# Patient Record
Sex: Male | Born: 1985 | Race: White | Hispanic: No | Marital: Single | Smoking: Never smoker
Health system: Southern US, Community
[De-identification: ages and names within clinical notes are randomized; demographics above are authoritative.]

## PROBLEM LIST (undated history)

## (undated) DIAGNOSIS — M419 Scoliosis, unspecified: Secondary | ICD-10-CM

## (undated) DIAGNOSIS — F32A Depression, unspecified: Secondary | ICD-10-CM

## (undated) DIAGNOSIS — F429 Obsessive-compulsive disorder, unspecified: Secondary | ICD-10-CM

## (undated) DIAGNOSIS — F909 Attention-deficit hyperactivity disorder, unspecified type: Secondary | ICD-10-CM

## (undated) DIAGNOSIS — T7840XA Allergy, unspecified, initial encounter: Secondary | ICD-10-CM

## (undated) DIAGNOSIS — K219 Gastro-esophageal reflux disease without esophagitis: Secondary | ICD-10-CM

## (undated) DIAGNOSIS — G43909 Migraine, unspecified, not intractable, without status migrainosus: Secondary | ICD-10-CM

## (undated) DIAGNOSIS — M549 Dorsalgia, unspecified: Secondary | ICD-10-CM

## (undated) DIAGNOSIS — J45909 Unspecified asthma, uncomplicated: Secondary | ICD-10-CM

## (undated) DIAGNOSIS — F329 Major depressive disorder, single episode, unspecified: Secondary | ICD-10-CM

## (undated) HISTORY — DX: Attention-deficit hyperactivity disorder, unspecified type: F90.9

## (undated) HISTORY — DX: Scoliosis, unspecified: M41.9

## (undated) HISTORY — DX: Allergy, unspecified, initial encounter: T78.40XA

## (undated) HISTORY — DX: Unspecified asthma, uncomplicated: J45.909

## (undated) HISTORY — DX: Obsessive-compulsive disorder, unspecified: F42.9

## (undated) HISTORY — DX: Depression, unspecified: F32.A

## (undated) HISTORY — DX: Major depressive disorder, single episode, unspecified: F32.9

## (undated) HISTORY — DX: Gastro-esophageal reflux disease without esophagitis: K21.9

## (undated) HISTORY — DX: Dorsalgia, unspecified: M54.9

## (undated) HISTORY — DX: Migraine, unspecified, not intractable, without status migrainosus: G43.909

---
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
## 2006-09-14 ENCOUNTER — Emergency Department: Payer: Self-pay | Admitting: Emergency Medicine

## 2006-09-21 ENCOUNTER — Ambulatory Visit: Payer: Self-pay | Admitting: Pediatrics

## 2006-10-15 ENCOUNTER — Emergency Department: Payer: Self-pay | Admitting: Emergency Medicine

## 2007-10-04 ENCOUNTER — Other Ambulatory Visit: Payer: Self-pay

## 2007-10-04 ENCOUNTER — Ambulatory Visit: Payer: Self-pay | Admitting: Pediatrics

## 2008-05-10 ENCOUNTER — Emergency Department: Payer: Self-pay | Admitting: Unknown Physician Specialty

## 2011-01-17 ENCOUNTER — Emergency Department: Payer: Self-pay | Admitting: *Deleted

## 2013-10-01 ENCOUNTER — Emergency Department: Payer: Self-pay | Admitting: Emergency Medicine

## 2013-10-05 ENCOUNTER — Emergency Department: Payer: Self-pay | Admitting: Student

## 2013-10-05 LAB — URINALYSIS, COMPLETE
BILIRUBIN, UR: NEGATIVE
BLOOD: NEGATIVE
Bacteria: NONE SEEN
Glucose,UR: NEGATIVE mg/dL (ref 0–75)
Ketone: NEGATIVE
LEUKOCYTE ESTERASE: NEGATIVE
Nitrite: NEGATIVE
PH: 7 (ref 4.5–8.0)
PROTEIN: NEGATIVE
RBC,UR: NONE SEEN /HPF (ref 0–5)
SQUAMOUS EPITHELIAL: NONE SEEN
Specific Gravity: 1.005 (ref 1.003–1.030)
WBC UR: NONE SEEN /HPF (ref 0–5)

## 2013-10-26 DIAGNOSIS — N411 Chronic prostatitis: Secondary | ICD-10-CM | POA: Insufficient documentation

## 2013-10-26 DIAGNOSIS — N3090 Cystitis, unspecified without hematuria: Secondary | ICD-10-CM | POA: Insufficient documentation

## 2013-10-26 DIAGNOSIS — R1032 Left lower quadrant pain: Secondary | ICD-10-CM | POA: Insufficient documentation

## 2013-12-06 DIAGNOSIS — N23 Unspecified renal colic: Secondary | ICD-10-CM | POA: Insufficient documentation

## 2014-06-14 DIAGNOSIS — L918 Other hypertrophic disorders of the skin: Secondary | ICD-10-CM | POA: Insufficient documentation

## 2014-06-14 DIAGNOSIS — M4127 Other idiopathic scoliosis, lumbosacral region: Secondary | ICD-10-CM | POA: Insufficient documentation

## 2015-02-06 ENCOUNTER — Other Ambulatory Visit: Payer: Self-pay | Admitting: Family Medicine

## 2015-05-16 DIAGNOSIS — F411 Generalized anxiety disorder: Secondary | ICD-10-CM | POA: Diagnosis not present

## 2015-05-16 DIAGNOSIS — F900 Attention-deficit hyperactivity disorder, predominantly inattentive type: Secondary | ICD-10-CM | POA: Diagnosis not present

## 2015-06-14 DIAGNOSIS — F900 Attention-deficit hyperactivity disorder, predominantly inattentive type: Secondary | ICD-10-CM | POA: Diagnosis not present

## 2015-06-14 DIAGNOSIS — F411 Generalized anxiety disorder: Secondary | ICD-10-CM | POA: Diagnosis not present

## 2015-07-04 ENCOUNTER — Encounter: Payer: Self-pay | Admitting: *Deleted

## 2015-07-04 DIAGNOSIS — G894 Chronic pain syndrome: Secondary | ICD-10-CM | POA: Insufficient documentation

## 2015-07-04 DIAGNOSIS — F429 Obsessive-compulsive disorder, unspecified: Secondary | ICD-10-CM | POA: Insufficient documentation

## 2015-07-04 DIAGNOSIS — F909 Attention-deficit hyperactivity disorder, unspecified type: Secondary | ICD-10-CM | POA: Insufficient documentation

## 2015-07-04 DIAGNOSIS — J45909 Unspecified asthma, uncomplicated: Secondary | ICD-10-CM | POA: Insufficient documentation

## 2015-07-04 DIAGNOSIS — K219 Gastro-esophageal reflux disease without esophagitis: Secondary | ICD-10-CM | POA: Insufficient documentation

## 2015-07-05 ENCOUNTER — Ambulatory Visit (INDEPENDENT_AMBULATORY_CARE_PROVIDER_SITE_OTHER): Payer: Medicare Other | Admitting: Family Medicine

## 2015-07-05 ENCOUNTER — Encounter: Payer: Self-pay | Admitting: Family Medicine

## 2015-07-05 VITALS — BP 127/81 | HR 78 | Temp 98.0°F | Resp 16 | Ht 74.0 in | Wt 204.0 lb

## 2015-07-05 DIAGNOSIS — M545 Low back pain, unspecified: Secondary | ICD-10-CM

## 2015-07-05 DIAGNOSIS — J4520 Mild intermittent asthma, uncomplicated: Secondary | ICD-10-CM

## 2015-07-05 MED ORDER — MELOXICAM 15 MG PO TABS: 15.0000 mg | ORAL_TABLET | Freq: Every day | ORAL | Status: DC

## 2015-07-05 MED ORDER — BACLOFEN 10 MG PO TABS: 10.0000 mg | ORAL_TABLET | Freq: Three times a day (TID) | ORAL | Status: DC

## 2015-07-05 MED ORDER — BUDESONIDE-FORMOTEROL FUMARATE 160-4.5 MCG/ACT IN AERO: 2.0000 | INHALATION_SPRAY | Freq: Two times a day (BID) | RESPIRATORY_TRACT | Status: DC

## 2015-07-05 MED ORDER — ALBUTEROL SULFATE HFA 108 (90 BASE) MCG/ACT IN AERS: 2.0000 | INHALATION_SPRAY | Freq: Four times a day (QID) | RESPIRATORY_TRACT | Status: DC | PRN

## 2015-07-05 NOTE — Progress Notes (Signed)
Name: Phillip Buckley   MRN: [id number]    DOB: 01/01/1986   Date:07/05/2015       Progress Note  Subjective  Chief Complaint  Chief Complaint  Patient presents with  . Back Pain    h/o  . Asthma    HPI Here after long absence.  C/o some back pain on and off.  Has hx. Of scoliosis.  Sees Dr. Achilles Dunkope for Prostate pain.  Also sees [facility] for depression and OCD.  Has asthma.  Has not had a bad asthma flair 2 yrs ago.  Used Advair once daily.  Has ProAir rescue inhaler. No problem-specific assessment & plan notes found for this encounter.   Past Medical History  Diagnosis Date  . Asthma   . OCD (obsessive compulsive disorder)   . GERD (gastroesophageal reflux disease)   . ADHD (attention deficit hyperactivity disorder)   . Back pain     Past Surgical History  Procedure Laterality Date  . Tonsillectomy and adenoidectomy  1994    Family History  Problem Relation Age of Onset  . Osteoporosis Mother   . Diabetes Mellitus II      grandmother  . Cancer - Colon      grandfather    Social History   Social History  . Marital Status: Single    Spouse Name: N/A  . Number of Children: N/A  . Years of Education: N/A   Occupational History  . Not on file.   Social History Main Topics  . Smoking status: Never Smoker   . Smokeless tobacco: Never Used  . Alcohol Use: 0.0 oz/week    0 Standard drinks or equivalent per week     Comment: occasional  . Drug Use: No  . Sexual Activity: Not on file   Other Topics Concern  . Not on file   Social History Narrative     Current outpatient prescriptions:  .  ALPRAZolam (XANAX) 0.5 MG tablet, Take 0.5 mg by mouth daily., Disp: , Rfl: 0 .  FLUoxetine HCl 60 MG TABS, Take 1 tablet by mouth every morning., Disp: , Rfl: 0 .  meloxicam (MOBIC) 15 MG tablet, Take 1 tablet (15 mg total) by mouth daily., Disp: 30 tablet, Rfl: 12 .  omeprazole (PRILOSEC) 20 MG capsule, Take 20 mg by mouth daily., Disp: , Rfl:  .  tamsulosin (FLOMAX)  0.4 MG CAPS capsule, Take 0.4 mg by mouth daily., Disp: , Rfl: 0 .  zolpidem (AMBIEN) 10 MG tablet, Take 10 mg by mouth at bedtime as needed for sleep., Disp: , Rfl:  .  albuterol (PROVENTIL HFA;VENTOLIN HFA) 108 (90 Base) MCG/ACT inhaler, Inhale 2 puffs into the lungs every 6 (six) hours as needed for wheezing or shortness of breath., Disp: 1 Inhaler, Rfl: 12 .  baclofen (LIORESAL) 10 MG tablet, Take 1 tablet (10 mg total) by mouth 3 (three) times daily., Disp: 60 each, Rfl: 6 .  budesonide-formoterol (SYMBICORT) 160-4.5 MCG/ACT inhaler, Inhale 2 puffs into the lungs 2 (two) times daily., Disp: 1 Inhaler, Rfl: 12  Allergies  Allergen Reactions  . Benadryl [Diphenhydramine Hcl]   . Codeine Phosphate [Codeine]   . Penicillins      Review of Systems  Constitutional: Negative for fever, chills, weight loss and malaise/fatigue.  HENT: Negative for hearing loss.   Eyes: Negative for blurred vision and double vision.  Respiratory: Negative for cough, shortness of breath and wheezing.   Cardiovascular: Negative for chest pain, palpitations and leg swelling.  Gastrointestinal:  Negative for heartburn, abdominal pain and blood in stool.  Genitourinary: Negative for dysuria, urgency and frequency.  Musculoskeletal: Positive for back pain (scoliosis).  Skin: Negative for rash.  Neurological: Negative for tremors, weakness and headaches.      Objective  Filed Vitals:   07/05/15 1356  BP: 127/81  Pulse: 78  Temp: 98 F (36.7 C)  TempSrc: Oral  Resp: 16  Height: 6\' 2"  (1.88 m)  Weight: 204 lb (92.534 kg)    Physical Exam  Constitutional: He is oriented to person, place, and time and well-developed, well-nourished, and in no distress. No distress.  HENT:  Head: Normocephalic and atraumatic.  Eyes: Conjunctivae and EOM are normal. Pupils are equal, round, and reactive to light.  Neck: Normal range of motion. Neck supple. Carotid bruit is not present. No thyromegaly present.   Cardiovascular: Normal rate, regular rhythm and normal heart sounds.  Exam reveals no gallop and no friction rub.   No murmur heard. Pulmonary/Chest: Effort normal and breath sounds normal. No respiratory distress. He has no wheezes. He has no rales.  Abdominal: Soft. Bowel sounds are normal.  Musculoskeletal:  Mild scoliosis of thoracic and lumbar spine.  Some pain with flexion and extension of back.  Lymphadenopathy:    He has no cervical adenopathy.  Neurological: He is alert and oriented to person, place, and time.  Vitals reviewed.      No results found for this or any previous visit (from the past 2160 hour(s)).   Assessment & Plan  Problem List Items Addressed This Visit      Respiratory   Asthma   Relevant Medications   budesonide-formoterol (SYMBICORT) 160-4.5 MCG/ACT inhaler   albuterol (PROVENTIL HFA;VENTOLIN HFA) 108 (90 Base) MCG/ACT inhaler     Other   Low back pain - Primary   Relevant Medications   meloxicam (MOBIC) 15 MG tablet   baclofen (LIORESAL) 10 MG tablet      Meds ordered this encounter  Medications  . ALPRAZolam (XANAX) 0.5 MG tablet    Sig: Take 0.5 mg by mouth daily.    Refill:  0  . DISCONTD: ADVAIR DISKUS 250-50 MCG/DOSE AEPB    Sig: Inhale 1 puff into the lungs as needed.    Refill:  1  . FLUoxetine HCl 60 MG TABS    Sig: Take 1 tablet by mouth every morning.    Refill:  0  . tamsulosin (FLOMAX) 0.4 MG CAPS capsule    Sig: Take 0.4 mg by mouth daily.    Refill:  0  . omeprazole (PRILOSEC) 20 MG capsule    Sig: Take 20 mg by mouth daily.  Marland Kitchen DISCONTD: meloxicam (MOBIC) 15 MG tablet    Sig: Take 15 mg by mouth daily.  Marland Kitchen DISCONTD: cyclobenzaprine (FLEXERIL) 10 MG tablet    Sig: Take 10 mg by mouth daily as needed for muscle spasms.  Marland Kitchen zolpidem (AMBIEN) 10 MG tablet    Sig: Take 10 mg by mouth at bedtime as needed for sleep.  . meloxicam (MOBIC) 15 MG tablet    Sig: Take 1 tablet (15 mg total) by mouth daily.    Dispense:  30  tablet    Refill:  12  . baclofen (LIORESAL) 10 MG tablet    Sig: Take 1 tablet (10 mg total) by mouth 3 (three) times daily.    Dispense:  60 each    Refill:  6  . budesonide-formoterol (SYMBICORT) 160-4.5 MCG/ACT inhaler    Sig: Inhale 2 puffs  into the lungs 2 (two) times daily.    Dispense:  1 Inhaler    Refill:  12  . albuterol (PROVENTIL HFA;VENTOLIN HFA) 108 (90 Base) MCG/ACT inhaler    Sig: Inhale 2 puffs into the lungs every 6 (six) hours as needed for wheezing or shortness of breath.    Dispense:  1 Inhaler    Refill:  12   1. Bilateral low back pain without sciatica  - meloxicam (MOBIC) 15 MG tablet; Take 1 tablet (15 mg total) by mouth daily.  Dispense: 30 tablet; Refill: 12 - baclofen (LIORESAL) 10 MG tablet; Take 1 tablet (10 mg total) by mouth 3 (three) times daily.  Dispense: 60 each; Refill: 6  2. Asthma, mild intermittent, uncomplicated  - budesonide-formoterol (SYMBICORT) 160-4.5 MCG/ACT inhaler; Inhale 2 puffs into the lungs 2 (two) times daily.  Dispense: 1 Inhaler; Refill: 12 - albuterol (PROVENTIL HFA;VENTOLIN HFA) 108 (90 Base) MCG/ACT inhaler; Inhale 2 puffs into the lungs every 6 (six) hours as needed for wheezing or shortness of breath.  Dispense: 1 Inhaler; Refill: 12

## 2015-07-05 NOTE — Patient Instructions (Signed)
Cont to go to [facility] and cont. To see Dr. Achilles Dunkope for urology.

## 2015-07-09 DIAGNOSIS — F411 Generalized anxiety disorder: Secondary | ICD-10-CM | POA: Diagnosis not present

## 2015-07-09 DIAGNOSIS — F900 Attention-deficit hyperactivity disorder, predominantly inattentive type: Secondary | ICD-10-CM | POA: Diagnosis not present

## 2015-07-13 ENCOUNTER — Other Ambulatory Visit: Payer: Self-pay | Admitting: Family Medicine

## 2015-07-13 NOTE — Telephone Encounter (Signed)
Called pharmacy RE: changing Advair to Symbicort 160/4.5 mg.

## 2015-07-18 ENCOUNTER — Other Ambulatory Visit: Payer: Self-pay | Admitting: Family Medicine

## 2015-07-18 DIAGNOSIS — M545 Low back pain, unspecified: Secondary | ICD-10-CM

## 2015-07-18 MED ORDER — OMEPRAZOLE 20 MG PO CPDR: 20.0000 mg | DELAYED_RELEASE_CAPSULE | Freq: Every day | ORAL | Status: DC

## 2015-08-03 DIAGNOSIS — F900 Attention-deficit hyperactivity disorder, predominantly inattentive type: Secondary | ICD-10-CM | POA: Diagnosis not present

## 2015-08-03 DIAGNOSIS — F411 Generalized anxiety disorder: Secondary | ICD-10-CM | POA: Diagnosis not present

## 2015-09-07 DIAGNOSIS — F411 Generalized anxiety disorder: Secondary | ICD-10-CM | POA: Diagnosis not present

## 2015-09-25 ENCOUNTER — Ambulatory Visit (INDEPENDENT_AMBULATORY_CARE_PROVIDER_SITE_OTHER): Payer: Medicare Other | Admitting: Family Medicine

## 2015-09-25 ENCOUNTER — Encounter: Payer: Self-pay | Admitting: Family Medicine

## 2015-09-25 VITALS — BP 120/79 | HR 69 | Temp 98.5°F | Ht 74.0 in | Wt 214.4 lb

## 2015-09-25 DIAGNOSIS — F429 Obsessive-compulsive disorder, unspecified: Secondary | ICD-10-CM | POA: Diagnosis not present

## 2015-09-25 DIAGNOSIS — S39011D Strain of muscle, fascia and tendon of abdomen, subsequent encounter: Secondary | ICD-10-CM | POA: Diagnosis not present

## 2015-09-25 DIAGNOSIS — S76219A Strain of adductor muscle, fascia and tendon of unspecified thigh, initial encounter: Secondary | ICD-10-CM | POA: Insufficient documentation

## 2015-09-25 DIAGNOSIS — F901 Attention-deficit hyperactivity disorder, predominantly hyperactive type: Secondary | ICD-10-CM

## 2015-09-25 DIAGNOSIS — J4520 Mild intermittent asthma, uncomplicated: Secondary | ICD-10-CM

## 2015-09-25 MED ORDER — METAXALONE 800 MG PO TABS: 800.0000 mg | ORAL_TABLET | Freq: Three times a day (TID) | ORAL | 3 refills | Status: DC

## 2015-09-25 NOTE — Progress Notes (Signed)
Name: Phillip Buckley   MRN: [id number]    DOB: May 23, 1985   Date:09/25/2015       Progress Note  Subjective  Chief Complaint  Chief Complaint  Patient presents with  . paperwork  . Hernia    pressure in right groin    HPI Here for 2 issues.  Needs note to take to social worker to get disability checks and insurance coverage in his name instead of through his mother.  He is disabled b/o "nerves".  He is no longer a minor.  Sees Psych for his Mental health issues.  Also still has a "pulling" sensation in R groin intermittantly.  Mildly painful.  No hernia has been found on evaluation.  Flexeril helps some.  No problem-specific Assessment & Plan notes found for this encounter.   Past Medical History:  Diagnosis Date  . ADHD (attention deficit hyperactivity disorder)   . Asthma   . Back pain   . GERD (gastroesophageal reflux disease)   . OCD (obsessive compulsive disorder)     Past Surgical History:  Procedure Laterality Date  . TONSILLECTOMY AND ADENOIDECTOMY  1994    Family History  Problem Relation Age of Onset  . Osteoporosis Mother   . Diabetes Mellitus II      grandmother  . Cancer - Colon      grandfather    Social History   Social History  . Marital status: Single    Spouse name: N/A  . Number of children: N/A  . Years of education: N/A   Occupational History  . Not on file.   Social History Main Topics  . Smoking status: Never Smoker  . Smokeless tobacco: Never Used  . Alcohol use 0.0 oz/week     Comment: occasional  . Drug use: No  . Sexual activity: Not on file   Other Topics Concern  . Not on file   Social History Narrative  . No narrative on file     Current Outpatient Prescriptions:  .  albuterol (PROVENTIL HFA;VENTOLIN HFA) 108 (90 Base) MCG/ACT inhaler, Inhale 2 puffs into the lungs every 6 (six) hours as needed for wheezing or shortness of breath., Disp: 1 Inhaler, Rfl: 12 .  ALPRAZolam (XANAX) 0.5 MG tablet, Take 0.5 mg by mouth  daily., Disp: , Rfl: 0 .  amphetamine-dextroamphetamine (ADDERALL XR) 20 MG 24 hr capsule, Take 20 mg by mouth daily., Disp: , Rfl:  .  budesonide-formoterol (SYMBICORT) 160-4.5 MCG/ACT inhaler, Inhale 2 puffs into the lungs 2 (two) times daily., Disp: 1 Inhaler, Rfl: 12 .  divalproex (DEPAKOTE ER) 500 MG 24 hr tablet, Take 500 mg by mouth daily., Disp: , Rfl:  .  FLUoxetine HCl 60 MG TABS, Take 1 tablet by mouth every morning., Disp: , Rfl: 0 .  Fluticasone-Salmeterol (ADVAIR DISKUS) 250-50 MCG/DOSE AEPB, Inhale 1 puff into the lungs daily., Disp: , Rfl:  .  meloxicam (MOBIC) 15 MG tablet, Take 1 tablet (15 mg total) by mouth daily., Disp: 30 tablet, Rfl: 12 .  naproxen (NAPROSYN) 500 MG tablet, Take 500 mg by mouth as needed., Disp: , Rfl:  .  omeprazole (PRILOSEC) 20 MG capsule, Take 1 capsule (20 mg total) by mouth daily., Disp: 30 capsule, Rfl: 12 .  tamsulosin (FLOMAX) 0.4 MG CAPS capsule, Take 0.4 mg by mouth daily., Disp: , Rfl: 0 .  baclofen (LIORESAL) 10 MG tablet, Take 1 tablet (10 mg total) by mouth 3 (three) times daily. (Patient not taking: Reported on 09/25/2015), Disp: 60  each, Rfl: 6 .  cetirizine (ZYRTEC) 10 MG tablet, Take 10 mg by mouth daily., Disp: , Rfl:  .  cyclobenzaprine (FLEXERIL) 10 MG tablet, Take 10 mg by mouth daily., Disp: , Rfl:  .  metaxalone (SKELAXIN) 800 MG tablet, Take 1 tablet (800 mg total) by mouth 3 (three) times daily., Disp: 40 tablet, Rfl: 3  Allergies  Allergen Reactions  . Benadryl [Diphenhydramine Hcl]   . Codeine Phosphate [Codeine]   . Penicillins      Review of Systems  Constitutional: Negative for chills, fever, malaise/fatigue and weight loss.  HENT: Negative for hearing loss.   Eyes: Negative for blurred vision and double vision.  Respiratory: Negative for cough, shortness of breath and wheezing.   Cardiovascular: Negative for chest pain and palpitations.  Gastrointestinal: Positive for abdominal pain (intermittant pain in R groin).  Negative for blood in stool, heartburn and nausea.  Genitourinary: Negative for dysuria, frequency and urgency.  Musculoskeletal: Positive for myalgias. Negative for joint pain.  Skin: Negative for rash.  Neurological: Negative for weakness and headaches.  Psychiatric/Behavioral: The patient is nervous/anxious (mild).      Objective  Vitals:   09/25/15 1021  BP: 120/79  Pulse: 69  Temp: 98.5 F (36.9 C)  TempSrc: Oral  Weight: 214 lb 6.4 oz (97.3 kg)  Height: 6\' 2"  (1.88 m)    Physical Exam  Constitutional: He is oriented to person, place, and time and well-developed, well-nourished, and in no distress. No distress.  HENT:  Head: Normocephalic and atraumatic.  Eyes: Conjunctivae and EOM are normal. Pupils are equal, round, and reactive to light. No scleral icterus.  Neck: Normal range of motion. Neck supple. Carotid bruit is not present. No thyromegaly present.  Cardiovascular: Normal rate, regular rhythm and normal heart sounds.   Pulmonary/Chest: Effort normal and breath sounds normal.  Abdominal: Soft. Bowel sounds are normal. He exhibits no distension and no mass. There is no tenderness.  No tenderness or mass in R groin today.  Musculoskeletal: He exhibits no edema.  Lymphadenopathy:    He has no cervical adenopathy.  Neurological: He is alert and oriented to person, place, and time.  Psychiatric:  Mildly anxious.  Has OCD  Vitals reviewed.      No results found for this or any previous visit (from the past 2160 hour(s)).   Assessment & Plan  Problem List Items Addressed This Visit      Respiratory   Asthma   Relevant Medications   Fluticasone-Salmeterol (ADVAIR DISKUS) 250-50 MCG/DOSE AEPB     Musculoskeletal and Integument   Strain of groin   Relevant Medications   metaxalone (SKELAXIN) 800 MG tablet     Other   OCD (obsessive compulsive disorder) - Primary   ADHD (attention deficit hyperactivity disorder)    Other Visit Diagnoses   None.      Meds ordered this encounter  Medications  . Fluticasone-Salmeterol (ADVAIR DISKUS) 250-50 MCG/DOSE AEPB    Sig: Inhale 1 puff into the lungs daily.  . cyclobenzaprine (FLEXERIL) 10 MG tablet    Sig: Take 10 mg by mouth daily.  Marland Kitchen amphetamine-dextroamphetamine (ADDERALL XR) 20 MG 24 hr capsule    Sig: Take 20 mg by mouth daily.  . divalproex (DEPAKOTE ER) 500 MG 24 hr tablet    Sig: Take 500 mg by mouth daily.  . cetirizine (ZYRTEC) 10 MG tablet    Sig: Take 10 mg by mouth daily.  . naproxen (NAPROSYN) 500 MG tablet    Sig:  Take 500 mg by mouth as needed.  . metaxalone (SKELAXIN) 800 MG tablet    Sig: Take 1 tablet (800 mg total) by mouth 3 (three) times daily.    Dispense:  40 tablet    Refill:  3     1. OCD (obsessive compulsive disorder) Cot to see Psych. Get note from {sych re: his competenance to handle money  2. Attention-deficit hyperactivity disorder, predominantly hyperactive type   3. Asthma, mild intermittent, uncomplicated   4. Strain of groin, subsequent encounter  - metaxalone (SKELAXIN) 800 MG tablet; Take 1 tablet (800 mg total) by mouth 3 (three) times daily.  Dispense: 40 tablet; Refill: 3

## 2015-09-25 NOTE — Patient Instructions (Signed)
Needs note from his Psychiatrist attesting to his competence with handling his finances.  I will attest to Medicare and Child psychotherapist after that.

## 2015-10-05 DIAGNOSIS — F411 Generalized anxiety disorder: Secondary | ICD-10-CM | POA: Diagnosis not present

## 2015-10-09 DIAGNOSIS — F411 Generalized anxiety disorder: Secondary | ICD-10-CM | POA: Diagnosis not present

## 2015-10-21 DIAGNOSIS — K089 Disorder of teeth and supporting structures, unspecified: Secondary | ICD-10-CM | POA: Diagnosis not present

## 2015-10-21 DIAGNOSIS — K047 Periapical abscess without sinus: Secondary | ICD-10-CM | POA: Diagnosis not present

## 2015-10-23 ENCOUNTER — Telehealth: Payer: Self-pay | Admitting: Family Medicine

## 2015-10-23 NOTE — Telephone Encounter (Signed)
Patient has already tried Baclofen and it didn't help. What else can he try?

## 2015-10-23 NOTE — Telephone Encounter (Signed)
Send in Baclofen, 10 mg., 1 up to three times a day for muscle spasm (/330 / 1 refill).-jh

## 2015-10-23 NOTE — Telephone Encounter (Signed)
Will try Cyclobenzaprine 10 mg., 1 up to three times a day for muscle spasm.  #30 /1 refill.

## 2015-10-23 NOTE — Telephone Encounter (Signed)
Pt. Called states that insurance will not  Pay for  Metaxalone  800 mg requesting something  Karolee Ohslse to be called in  . Pt call back  # is  [phone]

## 2015-10-24 NOTE — Telephone Encounter (Signed)
Attempted to call patient no vmail setup.

## 2015-10-25 NOTE — Telephone Encounter (Signed)
Patient tried cyclobenzaprine already before Baclofen.

## 2015-10-25 NOTE — Telephone Encounter (Signed)
I am out of choices.  He could try the Baclofen and pay the higher co-pay.  I have no other suggestions.-jh

## 2015-10-26 NOTE — Telephone Encounter (Signed)
Unable to contact patient will close message.

## 2015-11-07 DIAGNOSIS — F411 Generalized anxiety disorder: Secondary | ICD-10-CM | POA: Diagnosis not present

## 2015-12-05 DIAGNOSIS — F411 Generalized anxiety disorder: Secondary | ICD-10-CM | POA: Diagnosis not present

## 2015-12-05 DIAGNOSIS — F900 Attention-deficit hyperactivity disorder, predominantly inattentive type: Secondary | ICD-10-CM | POA: Diagnosis not present

## 2016-01-07 ENCOUNTER — Ambulatory Visit (INDEPENDENT_AMBULATORY_CARE_PROVIDER_SITE_OTHER): Payer: Medicare Other | Admitting: Family Medicine

## 2016-01-07 ENCOUNTER — Encounter: Payer: Self-pay | Admitting: Family Medicine

## 2016-01-07 ENCOUNTER — Other Ambulatory Visit: Payer: Self-pay | Admitting: *Deleted

## 2016-01-07 VITALS — BP 125/82 | HR 69 | Temp 98.2°F | Resp 16 | Ht 74.0 in | Wt 215.0 lb

## 2016-01-07 DIAGNOSIS — M545 Low back pain: Secondary | ICD-10-CM | POA: Diagnosis not present

## 2016-01-07 DIAGNOSIS — G8929 Other chronic pain: Secondary | ICD-10-CM

## 2016-01-07 DIAGNOSIS — Z23 Encounter for immunization: Secondary | ICD-10-CM

## 2016-01-07 MED ORDER — BACLOFEN 20 MG PO TABS: 10.0000 mg | ORAL_TABLET | Freq: Four times a day (QID) | ORAL | 6 refills | Status: DC

## 2016-01-07 MED ORDER — NAPROXEN 500 MG PO TABS: 500.0000 mg | ORAL_TABLET | Freq: Two times a day (BID) | ORAL | 6 refills | Status: DC

## 2016-01-07 NOTE — Progress Notes (Signed)
Name: Phillip Buckley   MRN: [id number]    DOB: Jun 22, 1985   Date:01/07/2016       Progress Note  Subjective  Chief Complaint  Chief Complaint  Patient presents with  . Follow-up    back pain    HPI Here for f/u of back pain that is chronic.  Takes Flexeril and Naproxyn every day.  He states that low dose Baclofen twice a day doesn't seem to help.  He has tried PT before and it has not been effective.   No problem-specific Assessment & Plan notes found for this encounter.   Past Medical History:  Diagnosis Date  . ADHD (attention deficit hyperactivity disorder)   . Asthma   . Back pain   . GERD (gastroesophageal reflux disease)   . OCD (obsessive compulsive disorder)     Past Surgical History:  Procedure Laterality Date  . TONSILLECTOMY AND ADENOIDECTOMY  1994    Family History  Problem Relation Age of Onset  . Osteoporosis Mother   . Diabetes Mellitus II      grandmother  . Cancer - Colon      grandfather    Social History   Social History  . Marital status: Single    Spouse name: N/A  . Number of children: N/A  . Years of education: N/A   Occupational History  . Not on file.   Social History Main Topics  . Smoking status: Never Smoker  . Smokeless tobacco: Never Used  . Alcohol use 0.0 oz/week     Comment: occasional  . Drug use: No  . Sexual activity: Not on file   Other Topics Concern  . Not on file   Social History Narrative  . No narrative on file     Current Outpatient Prescriptions:  .  albuterol (PROVENTIL HFA;VENTOLIN HFA) 108 (90 Base) MCG/ACT inhaler, Inhale 2 puffs into the lungs every 6 (six) hours as needed for wheezing or shortness of breath., Disp: 1 Inhaler, Rfl: 12 .  ALPRAZolam (XANAX) 0.5 MG tablet, Take 0.5 mg by mouth daily., Disp: , Rfl: 0 .  amphetamine-dextroamphetamine (ADDERALL XR) 20 MG 24 hr capsule, Take 20 mg by mouth daily., Disp: , Rfl:  .  baclofen (LIORESAL) 10 MG tablet, Take 1 tablet (10 mg total) by mouth 3  (three) times daily., Disp: 60 each, Rfl: 6 .  budesonide-formoterol (SYMBICORT) 160-4.5 MCG/ACT inhaler, Inhale 2 puffs into the lungs 2 (two) times daily., Disp: 1 Inhaler, Rfl: 12 .  cetirizine (ZYRTEC) 10 MG tablet, Take 10 mg by mouth daily., Disp: , Rfl:  .  cyclobenzaprine (FLEXERIL) 10 MG tablet, Take 10 mg by mouth daily., Disp: , Rfl:  .  divalproex (DEPAKOTE ER) 500 MG 24 hr tablet, Take 500 mg by mouth daily., Disp: , Rfl:  .  FLUoxetine HCl 60 MG TABS, Take 1 tablet by mouth every morning., Disp: , Rfl: 0 .  Fluticasone-Salmeterol (ADVAIR DISKUS) 250-50 MCG/DOSE AEPB, Inhale 1 puff into the lungs daily., Disp: , Rfl:  .  naproxen (NAPROSYN) 500 MG tablet, Take 500 mg by mouth as needed., Disp: , Rfl:  .  omeprazole (PRILOSEC) 20 MG capsule, Take 1 capsule (20 mg total) by mouth daily., Disp: 30 capsule, Rfl: 12 .  tamsulosin (FLOMAX) 0.4 MG CAPS capsule, Take 0.4 mg by mouth daily., Disp: , Rfl: 0  Allergies  Allergen Reactions  . Benadryl [Diphenhydramine Hcl]   . Codeine Phosphate [Codeine]   . Penicillins      Review  of Systems  Constitutional: Negative.   HENT: Negative.   Eyes: Negative.   Respiratory: Negative.   Cardiovascular: Negative.   Gastrointestinal: Negative.   Genitourinary: Negative.   Musculoskeletal: Positive for back pain (chronic).  Skin: Negative.   Neurological: Negative.   Endo/Heme/Allergies: Negative.       Objective  Vitals:   01/07/16 0812  BP: 125/82  Pulse: 69  Resp: 16  Temp: 98.2 F (36.8 C)  TempSrc: Oral  Weight: 215 lb (97.5 kg)  Height: 6\' 2"  (1.88 m)    Physical Exam  Constitutional: He is oriented to person, place, and time and well-developed, well-nourished, and in no distress. No distress.  Cardiovascular: Normal rate, regular rhythm and normal heart sounds.   Pulmonary/Chest: Effort normal and breath sounds normal.  Musculoskeletal: He exhibits no edema.  Mild pain to palpation of R thoracic parascapular  muscles and L lower lumbar muscles and L shoulder traps.    Neurological: He is alert and oriented to person, place, and time.  Vitals reviewed.      No results found for this or any previous visit (from the past 2160 hour(s)).   Assessment & Plan  Problem List Items Addressed This Visit    None    Visit Diagnoses    Need for vaccination    -  Primary   Relevant Orders   Flu Vaccine QUAD 36+ mos PF IM (Fluarix & Fluzone Quad PF) (Completed)      No orders of the defined types were placed in this encounter.

## 2016-01-18 ENCOUNTER — Other Ambulatory Visit: Payer: Self-pay | Admitting: Family Medicine

## 2016-01-18 MED ORDER — OMEPRAZOLE 20 MG PO CPDR: 20.0000 mg | DELAYED_RELEASE_CAPSULE | Freq: Every day | ORAL | 1 refills | Status: DC

## 2016-01-29 DIAGNOSIS — F411 Generalized anxiety disorder: Secondary | ICD-10-CM | POA: Diagnosis not present

## 2016-03-27 DIAGNOSIS — F411 Generalized anxiety disorder: Secondary | ICD-10-CM | POA: Diagnosis not present

## 2016-04-22 DIAGNOSIS — F411 Generalized anxiety disorder: Secondary | ICD-10-CM | POA: Diagnosis not present

## 2016-04-29 DIAGNOSIS — N411 Chronic prostatitis: Secondary | ICD-10-CM | POA: Diagnosis not present

## 2016-04-29 DIAGNOSIS — R1032 Left lower quadrant pain: Secondary | ICD-10-CM | POA: Diagnosis not present

## 2016-07-22 ENCOUNTER — Other Ambulatory Visit: Payer: Self-pay

## 2016-07-22 DIAGNOSIS — J45901 Unspecified asthma with (acute) exacerbation: Secondary | ICD-10-CM

## 2016-07-22 MED ORDER — ALBUTEROL SULFATE HFA 108 (90 BASE) MCG/ACT IN AERS: 2.0000 | INHALATION_SPRAY | Freq: Four times a day (QID) | RESPIRATORY_TRACT | 1 refills | Status: DC | PRN

## 2016-07-22 MED ORDER — BUDESONIDE-FORMOTEROL FUMARATE 160-4.5 MCG/ACT IN AERO: 2.0000 | INHALATION_SPRAY | Freq: Two times a day (BID) | RESPIRATORY_TRACT | 12 refills | Status: AC

## 2016-09-19 ENCOUNTER — Other Ambulatory Visit: Payer: Self-pay | Admitting: Family Medicine

## 2016-09-19 DIAGNOSIS — J45901 Unspecified asthma with (acute) exacerbation: Secondary | ICD-10-CM

## 2016-10-28 ENCOUNTER — Ambulatory Visit
Admission: RE | Admit: 2016-10-28 | Discharge: 2016-10-28 | Disposition: A | Discharge status: Home / Self Care | Payer: Medicare Other | Source: Ambulatory Visit | Attending: Family Medicine | Admitting: Family Medicine

## 2016-10-28 ENCOUNTER — Encounter: Payer: Self-pay | Admitting: Family Medicine

## 2016-10-28 ENCOUNTER — Ambulatory Visit (INDEPENDENT_AMBULATORY_CARE_PROVIDER_SITE_OTHER): Payer: Medicare Other | Admitting: Family Medicine

## 2016-10-28 ENCOUNTER — Other Ambulatory Visit: Payer: Self-pay | Admitting: Family Medicine

## 2016-10-28 VITALS — BP 136/67 | HR 69 | Temp 98.1°F | Resp 16 | Ht 74.0 in | Wt 220.0 lb

## 2016-10-28 DIAGNOSIS — M546 Pain in thoracic spine: Secondary | ICD-10-CM | POA: Insufficient documentation

## 2016-10-28 DIAGNOSIS — M545 Low back pain: Secondary | ICD-10-CM | POA: Diagnosis not present

## 2016-10-28 DIAGNOSIS — Z114 Encounter for screening for human immunodeficiency virus [HIV]: Secondary | ICD-10-CM

## 2016-10-28 DIAGNOSIS — R42 Dizziness and giddiness: Secondary | ICD-10-CM

## 2016-10-28 DIAGNOSIS — M47899 Other spondylosis, site unspecified: Secondary | ICD-10-CM | POA: Insufficient documentation

## 2016-10-28 DIAGNOSIS — M41127 Adolescent idiopathic scoliosis, lumbosacral region: Secondary | ICD-10-CM | POA: Insufficient documentation

## 2016-10-28 DIAGNOSIS — Z0000 Encounter for general adult medical examination without abnormal findings: Secondary | ICD-10-CM

## 2016-10-28 DIAGNOSIS — Z79899 Other long term (current) drug therapy: Secondary | ICD-10-CM

## 2016-10-28 DIAGNOSIS — F429 Obsessive-compulsive disorder, unspecified: Secondary | ICD-10-CM

## 2016-10-28 DIAGNOSIS — G894 Chronic pain syndrome: Secondary | ICD-10-CM | POA: Insufficient documentation

## 2016-10-28 DIAGNOSIS — G43009 Migraine without aura, not intractable, without status migrainosus: Secondary | ICD-10-CM

## 2016-10-28 DIAGNOSIS — M542 Cervicalgia: Secondary | ICD-10-CM | POA: Diagnosis not present

## 2016-10-28 DIAGNOSIS — R799 Abnormal finding of blood chemistry, unspecified: Secondary | ICD-10-CM

## 2016-10-28 MED ORDER — IBUPROFEN 600 MG PO TABS
600.0000 mg | ORAL_TABLET | Freq: Three times a day (TID) | ORAL | 1 refills | Status: AC | PRN
Start: 2016-10-28 — End: ?

## 2016-10-28 MED ORDER — GABAPENTIN 100 MG PO CAPS: ORAL_CAPSULE | ORAL | 1 refills | Status: DC

## 2016-10-28 NOTE — Progress Notes (Signed)
Subjective:    Patient ID: Phillip Buckley, male    DOB: Nov 12, 1985, 31 y.o.   MRN: [id number]  JAQUARIOUS Buckley is a 31 y.o. male presenting on 10/28/2016 for Headache (Headache, onset 1 month, only on R, some dizzy)   HPI   HEADACHE, Chronic Migraines: - Today reports he would have the occasional headache over past few years, but nothing like more recent worsening migraines, until now with past 1 month had worsening problem. No attributable lifestyle change Location: behind R eye Quality: Pressure, then gradual worsening to more aching throbbing Duration of headache without treatment: hours to day Current Frequency: increasing to 2-3x weekly, usually early AM (wakes up at 0400 normally) or late afternoon Longest duration without headache:  3-4 days Accompanying symptoms: Dizziness and vertigo with room spinning (occasional 1-2x weekly with more severe symptoms, brief lasting for few seconds to few minute), history of nausea, photophobia, phonophobia Effective treatment: Tylenol Extra Strength 500mg  x 2 pills, with some relief, may repeat later again in that night - No longer taking NSAIDs, previously used but has concerns family history of CKD Ineffective treatment: In past tried Baclofen for other problems History of headaches: - Prior history of chronic headaches, dx initially as cluster headaches when he was in grade school, saw neurology / headaches, even had MRI, he was treated with Depakote for these reasons, also history of spine scoliosis, and chronic neck and back pain History of imaging: prior history of MRI as child, and multiple x-rays in past Known triggers: loud noise (does some woodworking but wears ear protection) - Denies any hearing loss, numbness, tingling, ear pain  History of Lumbar Scoliosis (levoscoliosis) / Chronic Neck and Back Pain - Reviews chronic history, see above - Previously chiropractor Dr Patrici Ranks, has prior X-rays, contraindicated by chiro for neck  manipulation - Followed by [facility]rology [facility] Dr Achilles Dunk - 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 Still has Flexeril with some relief. Alprazolam 0.5mg  daily per Urology, increased pressure on spine affecting bladder and kidneys. He voids frequently during the day.  Followed by [facility] - taking Fluoxetine 60mg  daily, Adderall XR 20mg , for ADD, Anxiety, OCD  Health Maintenance: - Due for routine HIV screen, will get with labs   Social History  Substance Use Topics  . Smoking status: Never Smoker  . Smokeless tobacco: Never Used  . Alcohol use 0.0 oz/week     Comment: occasional    Review of Systems Per HPI unless specifically indicated above     Objective:    BP 136/67   Pulse 69   Temp 98.1 F (36.7 C) (Oral)   Resp 16   Ht 6\' 2"  (1.88 m)   Wt 220 lb (99.8 kg)   BMI 28.25 kg/m   Wt Readings from Last 3 Encounters:  10/28/16 220 lb (99.8 kg)  01/07/16 215 lb (97.5 kg)  09/25/15 214 lb 6.4 oz (97.3 kg)    Physical Exam  Constitutional: He is oriented to person, place, and time. He appears well-developed and well-nourished. No distress.  Well-appearing, comfortable, cooperative  HENT:  Head: Normocephalic and atraumatic.  Mouth/Throat: Oropharynx is clear and moist.  Eyes: Pupils are equal, round, and reactive to light. Conjunctivae and EOM are normal. Right eye exhibits no discharge. Left eye exhibits no discharge.  Neck: Normal range of motion. Neck supple. No thyromegaly present.  Neck Inspection: mostly normal appearance Palpation: mild tender muscles paraspinal with hypertonicity  L>R ROM: limited rotation to Left, but R is intact. Flex/ext mostly normal Strength: distal intact 5/5 Neurovascular: distal intact  Cardiovascular: Normal rate, regular rhythm, normal heart sounds and intact distal pulses.   No murmur heard. Pulmonary/Chest: Effort normal and breath sounds normal. No respiratory  distress. He has no wheezes. He has no rales.  Musculoskeletal: Normal range of motion. He exhibits no edema.  Low Back Inspection: thin body habitus, mild appreciable LEVOscoliosis lumbar spine deformity Palpation: No tenderness over spinous processes. Bilateral lumbar paraspinal muscles non-tender but with some mild hypertonicity and spasm ROM: Mostly full active ROM forward flex / back extension, rotation L/R without discomfort Special Testing: Seated SLR negative for radicular pain bilaterally Strength: Bilateral hip flex/ext 5/5, knee flex/ext 5/5, ankle dorsiflex/plantarflex 5/5 Neurovascular: intact distal sensation to light touch  Lymphadenopathy:    He has no cervical adenopathy.  Neurological: He is alert and oriented to person, place, and time.  Skin: Skin is warm and dry. No rash noted. He is not diaphoretic. No erythema.  Psychiatric: He has a normal mood and affect. His behavior is normal.  Well groomed, good eye contact, normal speech and thoughts  Nursing note and vitals reviewed.   I have personally reviewed the radiology report from 10/28/16 on Cervical, Thoracic, and Lumbar spine x-rays.  CLINICAL DATA:  Chronic back pain  EXAM: CERVICAL SPINE - COMPLETE 4+ VIEW  COMPARISON:  None.  FINDINGS: Normal alignment. No fracture. Disc spaces are maintained. Prevertebral soft tissues are normal.  IMPRESSION: No acute bony abnormality.   Electronically Signed   By: Charlett Nose M.D.   On: 10/28/2016 13:45  ----------------------------  CLINICAL DATA:  Chronic back pain  EXAM: LUMBAR SPINE - COMPLETE 4+ VIEW  COMPARISON:  None.  FINDINGS: Minimal leftward scoliosis in the lumbar spine. No fracture. Disc spaces are maintained. SI joints are symmetric and unremarkable.  IMPRESSION: No acute bony abnormality.   Electronically Signed   By: Charlett Nose M.D.   On: 10/28/2016 13:46  ----------------------------  CLINICAL DATA:  Chronic back  pain.  EXAM: THORACIC SPINE - 3 VIEWS  COMPARISON:  None.  FINDINGS: There is no evidence of thoracic spine fracture. Alignment is normal. No other significant bone abnormalities are identified.  IMPRESSION: Negative.   Electronically Signed   By: Charlett Nose M.D.   On: 10/28/2016 13:46   Results for orders placed or performed in visit on 10/05/13  Urinalysis, Complete  Result Value Ref Range   Color - urine Straw    Clarity - urine Clear    Glucose,UR Negative 0 - 75 mg/dL   Bilirubin,UR Negative NEGATIVE   Ketone Negative NEGATIVE   Specific Gravity 1.005 1.003 - 1.030   Blood Negative NEGATIVE   Ph 7.0 4.5 - 8.0   Protein Negative NEGATIVE   Nitrite Negative NEGATIVE   Leukocyte Esterase Negative NEGATIVE   RBC,UR NONE SEEN 0 - 5 /HPF   WBC UR NONE SEEN 0 - 5 /HPF   Bacteria NONE SEEN NONE SEEN   Squamous Epithelial NONE SEEN       Assessment & Plan:   Problem List Items Addressed This Visit    Migraine without aura and without status migrainosus, not intractable - Primary    Consistent with worsening chronic migraine HA, not active today. Inc frequency, uncertain exact triggers or etiology - Currently without active HA, well-appearing, no focal neuro deficits, tolerating PO w/o n/v - Inadequately treated for migraine HA (now off NSAIDs) and no other  significant prophylaxis  Plan: 1. Considered triptan abortive but hold for now, never been on before 2. Start with Ibuprofen 600mg  PRN infrequent dosing ONLY abortive, also reviewed Excedrin Migraine PRN use 3. Tylenol PRN breakthrough or minor headaches 4. Avoid triggers including foods, caffeine. Important to rest. - Discussion on future migraine prophylaxis medications - handout given - agree to start Gabapentin titration today, also for nerve and back pain 5. Start headache diary, identify triggers for avoidance, bring to next visit 6. Return criteria given for acute migraine, when to go to office vs  ED - Agree to refer to [facility] in future for chronic migraines and also for history of neuropathy and problem with spine      Relevant Medications   gabapentin (NEURONTIN) 100 MG capsule   ibuprofen (ADVIL,MOTRIN) 600 MG tablet   Idiopathic scoliosis of lumbosacral spine    Stable chronic problem, lumbar levoscoliosis, seems to have some sequela for other areas of spine by report - some chronic pain and limitations - on variety of medications - Followed by chiropractor  Plan: 1. Check X-rays today C / T / L spine - unremarkable without OA/DJD except mild Lumbar levoscoliosis 2. Start Ibuprofen PRN for migraine only not for back pain 3. Increase Tylenol regular dosing 4. Start Gabapentin titration for back pain, nerve issues and migraines 5. Follow-up in future may need referral formal PT or spine center if any concern with scoliosis, otherwise Neurology for other nerve symptoms       Relevant Medications   gabapentin (NEURONTIN) 100 MG capsule   Other Relevant Orders   DG Cervical Spine Complete (Completed)   DG Lumbar Spine Complete (Completed)   DG Thoracic Spine W/Swimmers (Completed)   Chronic pain syndrome   Relevant Medications   gabapentin (NEURONTIN) 100 MG capsule   Other Relevant Orders   DG Cervical Spine Complete (Completed)   DG Lumbar Spine Complete (Completed)   DG Thoracic Spine W/Swimmers (Completed)    Other Visit Diagnoses    Vertigo     - Some concern with BPPV based on positional changes of head/neck - Had episode in clinic when getting x-rays nearly passed out from sudden neck position change, promptly improved, handout given for Epley maneuver - Follow-up PRN    Other osteoarthritis of spine, unspecified spinal region       Relevant Medications   ibuprofen (ADVIL,MOTRIN) 600 MG tablet   Other Relevant Orders   DG Cervical Spine Complete (Completed)   DG Lumbar Spine Complete (Completed)   DG Thoracic Spine W/Swimmers  (Completed)      Meds ordered this encounter  Medications  . gabapentin (NEURONTIN) 100 MG capsule    Sig: Start 1 capsule daily, increase by 1 cap every 2-3 days as tolerated up to 3 times a day, or may take 3 at once in evening.    Dispense:  90 capsule    Refill:  1  . ibuprofen (ADVIL,MOTRIN) 600 MG tablet    Sig: Take 1 tablet (600 mg total) by mouth every 8 (eight) hours as needed.    Dispense:  30 tablet    Refill:  1    Follow up plan: Return in about 1 week (around 11/04/2016) for Annual Physical.  Saralyn Pilar, DO [facility] 10/29/2016, 12:05 AM

## 2016-10-28 NOTE — Patient Instructions (Addendum)
Thank you for coming to the clinic today.  1. You most likely have Chronic Migraine Headaches - Migraine headaches present differently for many patients, pain is usually throbbing or aching, often on one side of head or behind the eye. They tend to last for up to hours or days. In treating migraines, our goal is to 1) stop the headache and 2) prevent recurrence of headaches  Treatment to STOP the headache at this time: - ONLY if severe migraine headache, AS NEEDED - Start with Ibuprofen up to 600-800mg  per dose may repeat every 6-8 hours for up to a day, try not to take regularly or back to back days TAKE WITH FOOD,  - ALSO try Excedrin-Migraine OTC max dose per bottle  - Also can take Tylenol but try to avoid over-medication this can cause REBOUND headaches  Treatment to PREVENT headaches: - Goal is to avoid triggers. We need to learn more details on what are your exact or possible headache triggers first. - Keep detailed headache diary (on printed handout) for possible triggers, bring this to your next visit to discuss further - Known possible triggers include caffeine, chocolate, alcohol, stress, weather changes, menstrual cycle, certain other foods - Also be aware that OTC pain meds/anti-inflammatories can cause rebound headache, they help resolve the headache but then after the effect wears off they can CAUSE a headache. Try to taper down and stop these medications and allow them to get out of your system for 1-2 weeks   Look into some of these alternatives for Migraine Headache Prophylaxis or preventative treatment  Antidepressant Type - Venlafaxine, Amitriptyline Blood pressure meds - Metoprolol, Propanolol, Verapamil Anti Headache/Seizure/Nerve medications - Topamax, Gabapentin  Start Gabapentin 100mg  capsules, take at night for 2-3 nights only, and then increase to 2 times a day for a few days, and then may increase to 3 times a day, it may make you drowsy, if helps significantly at  night only, then you can increase instead to 3 capsules at night, instead of 3 times a day - In the future if needed, we can significantly increase the dose if tolerated well, some common doses are 300mg  three times a day up to 600mg  three times a day, usually it takes several weeks or months to get to higher doses  Please schedule a Follow-up Appointment to: Return in about 1 week (around 11/04/2016) for Annual Physical.  If you have any other questions or concerns, please feel free to call the clinic or send a message through MyChart. You may also schedule an earlier appointment if necessary.  Additionally, you may be receiving a survey about your experience at our clinic within a few days to 1 week by e-mail or mail. We value your feedback.  Saralyn Pilar, DO [facility]   You have symptoms of Vertigo (Benign Paroxysmal Positional Vertigo) - This is commonly caused by inner ear fluid imbalance, sometimes can be worsened by allergies and sinus symptoms, otherwise it can occur randomly sometimes and we may never discover the exact cause. - To treat this, try the Epley Manuever (see diagrams/instructions below) at home up to 3 times a day for 1-2 weeks or until symptoms resolve - You may take Meclizine as needed up to 3 times a day for dizziness, this will not cure symptoms but may help. Caution may make you drowsy.  If you develop significant worsening episode with vertigo that does not improve and you get severe headache, loss of vision, arm or  leg weakness, slurred speech, or other concerning symptoms please seek immediate medical attention at Emergency Department.  See the next page for images describing the Epley Manuever.     ----------------------------------------------------------------------------------------------------------------------

## 2016-10-29 NOTE — Assessment & Plan Note (Signed)
Stable chronic problem, lumbar levoscoliosis, seems to have some sequela for other areas of spine by report - some chronic pain and limitations - on variety of medications - Followed by chiropractor  Plan: 1. Check X-rays today C / T / L spine - unremarkable without OA/DJD except mild Lumbar levoscoliosis 2. Start Ibuprofen PRN for migraine only not for back pain 3. Increase Tylenol regular dosing 4. Start Gabapentin titration for back pain, nerve issues and migraines 5. Follow-up in future may need referral formal PT or spine center if any concern with scoliosis, otherwise Neurology for other nerve symptoms

## 2016-10-29 NOTE — Assessment & Plan Note (Signed)
Consistent with worsening chronic migraine HA, not active today. Inc frequency, uncertain exact triggers or etiology - Currently without active HA, well-appearing, no focal neuro deficits, tolerating PO w/o n/v - Inadequately treated for migraine HA (now off NSAIDs) and no other significant prophylaxis  Plan: 1. Considered triptan abortive but hold for now, never been on before 2. Start with Ibuprofen 600mg  PRN infrequent dosing ONLY abortive, also reviewed Excedrin Migraine PRN use 3. Tylenol PRN breakthrough or minor headaches 4. Avoid triggers including foods, caffeine. Important to rest. - Discussion on future migraine prophylaxis medications - handout given - agree to start Gabapentin titration today, also for nerve and back pain 5. Start headache diary, identify triggers for avoidance, bring to next visit 6. Return criteria given for acute migraine, when to go to office vs ED - [facility] future for chronic migraines and also for history of neuropathy and problem with spine

## 2016-10-30 ENCOUNTER — Other Ambulatory Visit: Payer: Medicare Other

## 2016-10-30 DIAGNOSIS — Z79899 Other long term (current) drug therapy: Secondary | ICD-10-CM

## 2016-10-30 DIAGNOSIS — G43009 Migraine without aura, not intractable, without status migrainosus: Secondary | ICD-10-CM

## 2016-10-30 DIAGNOSIS — F429 Obsessive-compulsive disorder, unspecified: Secondary | ICD-10-CM

## 2016-10-30 DIAGNOSIS — Z0000 Encounter for general adult medical examination without abnormal findings: Secondary | ICD-10-CM

## 2016-10-30 DIAGNOSIS — Z114 Encounter for screening for human immunodeficiency virus [HIV]: Secondary | ICD-10-CM

## 2016-10-30 LAB — CBC WITH DIFFERENTIAL/PLATELET
BASOS PCT: 0 %
Basophils Absolute: 0 cells/uL (ref 0–200)
EOS ABS: 86 {cells}/uL (ref 15–500)
Eosinophils Relative: 2 %
HEMATOCRIT: 44.7 % (ref 38.5–50.0)
HEMOGLOBIN: 14.9 g/dL (ref 13.2–17.1)
LYMPHS ABS: 1935 {cells}/uL (ref 850–3900)
Lymphocytes Relative: 45 %
MCH: 29.1 pg (ref 27.0–33.0)
MCHC: 33.3 g/dL (ref 32.0–36.0)
MCV: 87.3 fL (ref 80.0–100.0)
MONO ABS: 344 {cells}/uL (ref 200–950)
MPV: 9.6 fL (ref 7.5–12.5)
Monocytes Relative: 8 %
Neutro Abs: 1935 cells/uL (ref 1500–7800)
Neutrophils Relative %: 45 %
Platelets: 244 10*3/uL (ref 140–400)
RBC: 5.12 MIL/uL (ref 4.20–5.80)
RDW: 14.2 % (ref 11.0–15.0)
WBC: 4.3 10*3/uL (ref 3.8–10.8)

## 2016-10-31 ENCOUNTER — Encounter: Payer: Medicare Other | Admitting: Family Medicine

## 2016-10-31 LAB — COMPLETE METABOLIC PANEL WITH GFR
ALT: 43 U/L (ref 9–46)
AST: 26 U/L (ref 10–40)
Albumin: 4.3 g/dL (ref 3.6–5.1)
Alkaline Phosphatase: 89 U/L (ref 40–115)
BILIRUBIN TOTAL: 0.8 mg/dL (ref 0.2–1.2)
BUN: 10 mg/dL (ref 7–25)
CALCIUM: 9.1 mg/dL (ref 8.6–10.3)
CHLORIDE: 102 mmol/L (ref 98–110)
CO2: 26 mmol/L (ref 20–32)
CREATININE: 0.92 mg/dL (ref 0.60–1.35)
Glucose, Bld: 91 mg/dL (ref 65–99)
Potassium: 4.2 mmol/L (ref 3.5–5.3)
Sodium: 138 mmol/L (ref 135–146)
Total Protein: 6.6 g/dL (ref 6.1–8.1)

## 2016-10-31 LAB — HIV ANTIBODY (ROUTINE TESTING W REFLEX): HIV 1&2 Ab, 4th Generation: NONREACTIVE

## 2016-10-31 LAB — LIPID PANEL
CHOLESTEROL: 154 mg/dL (ref <200)
HDL: 45 mg/dL (ref >40)
LDL Cholesterol: 97 mg/dL (ref <100)
Total CHOL/HDL Ratio: 3.4 Ratio (ref <5.0)
Triglycerides: 59 mg/dL (ref <150)
VLDL: 12 mg/dL (ref <30)

## 2016-10-31 LAB — HEMOGLOBIN A1C
HEMOGLOBIN A1C: 5.2 % (ref <5.7)
MEAN PLASMA GLUCOSE: 103 mg/dL

## 2016-10-31 LAB — VALPROIC ACID LEVEL

## 2016-11-06 ENCOUNTER — Ambulatory Visit (INDEPENDENT_AMBULATORY_CARE_PROVIDER_SITE_OTHER): Payer: Medicare Other | Admitting: Family Medicine

## 2016-11-06 ENCOUNTER — Encounter: Payer: Self-pay | Admitting: Family Medicine

## 2016-11-06 VITALS — BP 119/69 | HR 72 | Temp 98.5°F | Resp 16 | Ht 74.0 in | Wt 221.6 lb

## 2016-11-06 DIAGNOSIS — G43009 Migraine without aura, not intractable, without status migrainosus: Secondary | ICD-10-CM

## 2016-11-06 DIAGNOSIS — Z0000 Encounter for general adult medical examination without abnormal findings: Secondary | ICD-10-CM

## 2016-11-06 DIAGNOSIS — R42 Dizziness and giddiness: Secondary | ICD-10-CM | POA: Diagnosis not present

## 2016-11-06 DIAGNOSIS — M41127 Adolescent idiopathic scoliosis, lumbosacral region: Secondary | ICD-10-CM | POA: Diagnosis not present

## 2016-11-06 DIAGNOSIS — G894 Chronic pain syndrome: Secondary | ICD-10-CM

## 2016-11-06 NOTE — Assessment & Plan Note (Signed)
Stable chronic problem, lumbar levoscoliosis, seems to have some sequela for other areas of spine by report - some chronic pain and limitations - on variety of medications - Followed by chiropractor - Last X-rays C / T / L spine - w/o OA/DJD except mild Lumbar levoscoliosis  Plan: 1. Reassurance 2. Increase regular Tylenol, not adhering to this 3. Titrate up gabapentin as discussed 4. Ibuprofen high dose only PRN migraine 5. Continue regular exercise, in future may need referral formal PT or spine center if any concern with scoliosis, otherwise Neurology for other nerve symptoms

## 2016-11-06 NOTE — Assessment & Plan Note (Signed)
Persistent stable to worsening chronic migraine HA, not active today. Inc frequency, uncertain exact triggers or etiology - Currently without active HA, well-appearing, no focal neuro deficits, tolerating PO w/o n/v - Failed Topamax, not taking Depakote  Plan: 1. Offered triptan again - agree to hold off for now, this is next line for abortive therapy likely oral sumatriptan 50mg  per dose, will phone in if requested 2. Continue with Ibuprofen 600-800mg  PRN infrequent dosing ONLY abortive, also reviewed Excedrin Migraine PRN use 3. Tylenol PRN breakthrough or minor headaches 4. Continue inc titration Gabapentin for prophylaxis now only 100mg  nightly, can inc to 300mg  nightly then may do TID dosing in future 5. Avoid triggers including foods, caffeine. Get good sleep 6. Again advised to start headache diary, identify triggers for avoidance, bring to next visit 7. Return criteria given for acute migraine, when to go to office vs ED - Agree to [facility] in future for chronic migraines and also for history of neuropathy and problem with spine - requested in 12/2016, waiting on referral now  Future consider Amitriptyline, SNRI

## 2016-11-06 NOTE — Progress Notes (Signed)
Subjective:    Patient ID: Phillip Buckley, male    DOB: 17-Aug-1985, 31 y.o.   MRN: [id number]  Phillip Buckley is a 31 y.o. male presenting on 11/06/2016 for Annual Exam   HPI  HEADACHE, Chronic Migraines: - Last visit with me 10/28/16, for initial visit with new provider, for same problem migraines, treated with new start Gabapentin for chronic pain and migraine prophylaxis, also Ibuprofen higher dose 633m migraine abortive therapy, information given on migraine prevention, see prior notes for background information. - Interval update with [facility], waiting until October for referral. Started Gabapentin only taking 1013mnightly has not inc dose. - Today patient reports still having regular migraine headaches, worse behind R eye, sometimes every other day, mild morning worse afternoon 2-3pm - Mixed results from ibuprofen 600. No relief yet on gabapentin. Tolerating well - Admits still has episodes of vertigo with dizziness, brief episodes lasting seconds to minutes, provoked by sudden positional change, last visit 8/28 had near syncope due to acute vertigo while getting x-ray it resolved, he states he was never given Epley maneuver handout - High tolerance for medications - Failed Topamax in past for migraines. Never tried Imitrex - Failed Trazodone and Ambien for sleep  Health Maintenance: - Declined Flu Shot today, may return for this - Declined repeat TDap, does not recall last dose, but think it was < 10 years ago - UTD on routine HIV screen, negative  Past Medical History:  Diagnosis Date  . ADHD (attention deficit hyperactivity disorder)   . Asthma   . Back pain   . GERD (gastroesophageal reflux disease)   . OCD (obsessive compulsive disorder)    Past Surgical History:  Procedure Laterality Date  . TONSILLECTOMY AND ADENOIDECTOMY  1994   Social History   Social History  . Marital status: Single    Spouse name: N/A  .  Number of children: N/A  . Years of education: N/A   Occupational History  . Not on file.   Social History Main Topics  . Smoking status: Never Smoker  . Smokeless tobacco: Never Used  . Alcohol use 0.0 oz/week     Comment: occasional  . Drug use: No  . Sexual activity: Not on file   Other Topics Concern  . Not on file   Social History Narrative  . No narrative on file   Family History  Problem Relation Age of Onset  . Osteoporosis Mother   . Diabetes Mellitus II Unknown        grandmother  . Cancer - Colon Unknown 7035     grandfather  . Diabetes Maternal Aunt   . Diabetes Maternal Grandmother   . Prostate cancer Neg Hx    Current Outpatient Prescriptions on File Prior to Visit  Medication Sig  . ALPRAZolam (XANAX) 0.5 MG tablet Take 0.5 mg by mouth daily.  . Marland Kitchenmphetamine-dextroamphetamine (ADDERALL XR) 20 MG 24 hr capsule Take 20 mg by mouth daily.  . budesonide-formoterol (SYMBICORT) 160-4.5 MCG/ACT inhaler Inhale 2 puffs into the lungs 2 (two) times daily.  . cetirizine (ZYRTEC) 10 MG tablet Take 10 mg by mouth daily.  . cyclobenzaprine (FLEXERIL) 10 MG tablet Take 10 mg by mouth daily.  . divalproex (DEPAKOTE ER) 500 MG 24 hr tablet Take 500 mg by mouth daily.  . Marland KitchenLUoxetine HCl 60 MG TABS Take 1 tablet by mouth every morning.  . Fluticasone-Salmeterol (ADVAIR DISKUS) 250-50 MCG/DOSE AEPB Inhale 1  puff into the lungs daily.  Marland Kitchen gabapentin (NEURONTIN) 100 MG capsule Start 1 capsule daily, increase by 1 cap every 2-3 days as tolerated up to 3 times a day, or may take 3 at once in evening.  Marland Kitchen ibuprofen (ADVIL,MOTRIN) 600 MG tablet Take 1 tablet (600 mg total) by mouth every 8 (eight) hours as needed.  Marland Kitchen omeprazole (PRILOSEC) 20 MG capsule Take 1 capsule (20 mg total) by mouth daily.  Marland Kitchen PROAIR HFA 108 (90 Base) MCG/ACT inhaler inhale 2 puffs by mouth every 6 hours if needed for wheezing or shortness of breath  . tamsulosin (FLOMAX) 0.4 MG CAPS capsule Take 0.4 mg by mouth  daily.   No current facility-administered medications on file prior to visit.     Review of Systems  Constitutional: Negative for activity change, appetite change, chills, diaphoresis, fatigue and fever.  HENT: Negative for congestion, hearing loss and sinus pressure.   Eyes: Negative for visual disturbance.  Respiratory: Negative for apnea, cough, chest tightness, shortness of breath and wheezing.   Cardiovascular: Negative for chest pain, palpitations and leg swelling.  Gastrointestinal: Negative for abdominal distention, abdominal pain, anal bleeding, blood in stool, constipation, diarrhea, nausea and vomiting.  Endocrine: Negative for cold intolerance and polyuria.  Genitourinary: Negative for decreased urine volume, difficulty urinating, dysuria, frequency, hematuria, scrotal swelling, testicular pain and urgency.  Musculoskeletal: Positive for back pain. Negative for arthralgias and neck pain.  Skin: Negative for rash.  Allergic/Immunologic: Negative for environmental allergies.  Neurological: Positive for dizziness and headaches. Negative for weakness, light-headedness and numbness.  Hematological: Negative for adenopathy.  Psychiatric/Behavioral: Negative for behavioral problems, dysphoric mood and sleep disturbance. The patient is not nervous/anxious.    Per HPI unless specifically indicated above     Objective:    BP 119/69   Pulse 72   Temp 98.5 F (36.9 C) (Oral)   Resp 16   Ht 6' 2"  (1.88 m)   Wt 221 lb 9.6 oz (100.5 kg)   BMI 28.45 kg/m   Wt Readings from Last 3 Encounters:  11/06/16 221 lb 9.6 oz (100.5 kg)  10/28/16 220 lb (99.8 kg)  01/07/16 215 lb (97.5 kg)    Physical Exam  Constitutional: He is oriented to person, place, and time. He appears well-developed and well-nourished. No distress.  Well-appearing, comfortable, cooperative  HENT:  Head: Normocephalic and atraumatic.  Mouth/Throat: Oropharynx is clear and moist.  Eyes: Pupils are equal, round,  and reactive to light. Conjunctivae and EOM are normal. Right eye exhibits no discharge. Left eye exhibits no discharge.  Neck: Normal range of motion. Neck supple. No thyromegaly present.  No carotid bruits  Cardiovascular: Normal rate, regular rhythm, normal heart sounds and intact distal pulses.   No murmur heard. Pulmonary/Chest: Effort normal and breath sounds normal. No respiratory distress. He has no wheezes. He has no rales.  Abdominal: Soft. Bowel sounds are normal. He exhibits no distension and no mass. There is no tenderness.  Musculoskeletal: Normal range of motion. He exhibits no edema or tenderness.  Upper / Lower Extremities: - Normal muscle tone, strength bilateral upper extremities 5/5, lower extremities 5/5  Low Back - Stable, unchanged Inspection: thin body habitus, mild appreciable LEVOscoliosis lumbar spine deformity Palpation: No tenderness over spinous processes. Bilateral lumbar paraspinal muscles non-tender but with some mild hypertonicity and spasm ROM: Mostly full active ROM forward flex / back extension, rotation L/R without discomfort Special Testing: Seated SLR negative for radicular pain bilaterally Strength: Bilateral hip flex/ext 5/5, knee  flex/ext 5/5, ankle dorsiflex/plantarflex 5/5 Neurovascular: intact distal sensation to light touch  Lymphadenopathy:    He has no cervical adenopathy.  Neurological: He is alert and oriented to person, place, and time.  Distal sensation intact to light touch all extremities  Skin: Skin is warm and dry. No rash noted. He is not diaphoretic. No erythema.  Psychiatric: He has a normal mood and affect. His behavior is normal.  Well groomed, good eye contact, normal speech and thoughts  Nursing note and vitals reviewed.    Results for orders placed or performed in visit on 10/30/16  COMPLETE METABOLIC PANEL WITH GFR  Result Value Ref Range   Sodium 138 135 - 146 mmol/L   Potassium 4.2 3.5 - 5.3 mmol/L   Chloride 102 98  - 110 mmol/L   CO2 26 20 - 32 mmol/L   Glucose, Bld 91 65 - 99 mg/dL   BUN 10 7 - 25 mg/dL   Creat 0.92 0.60 - 1.35 mg/dL   Total Bilirubin 0.8 0.2 - 1.2 mg/dL   Alkaline Phosphatase 89 40 - 115 U/L   AST 26 10 - 40 U/L   ALT 43 9 - 46 U/L   Total Protein 6.6 6.1 - 8.1 g/dL   Albumin 4.3 3.6 - 5.1 g/dL   Calcium 9.1 8.6 - 10.3 mg/dL   GFR, Est African American >89 >=60 mL/min   GFR, Est Non African American >89 >=60 mL/min  Hemoglobin A1c  Result Value Ref Range   Hgb A1c MFr Bld 5.2 <5.7 %   Mean Plasma Glucose 103 mg/dL  Lipid panel  Result Value Ref Range   Cholesterol 154 <200 mg/dL   Triglycerides 59 <150 mg/dL   HDL 45 >40 mg/dL   Total CHOL/HDL Ratio 3.4 <5.0 Ratio   VLDL 12 <30 mg/dL   LDL Cholesterol 97 <100 mg/dL  CBC with Differential/Platelet  Result Value Ref Range   WBC 4.3 3.8 - 10.8 K/uL   RBC 5.12 4.20 - 5.80 MIL/uL   Hemoglobin 14.9 13.2 - 17.1 g/dL   HCT 44.7 38.5 - 50.0 %   MCV 87.3 80.0 - 100.0 fL   MCH 29.1 27.0 - 33.0 pg   MCHC 33.3 32.0 - 36.0 g/dL   RDW 14.2 11.0 - 15.0 %   Platelets 244 140 - 400 K/uL   MPV 9.6 7.5 - 12.5 fL   Neutro Abs 1,935 1,500 - 7,800 cells/uL   Lymphs Abs 1,935 850 - 3,900 cells/uL   Monocytes Absolute 344 200 - 950 cells/uL   Eosinophils Absolute 86 15 - 500 cells/uL   Basophils Absolute 0 0 - 200 cells/uL   Neutrophils Relative % 45 %   Lymphocytes Relative 45 %   Monocytes Relative 8 %   Eosinophils Relative 2 %   Basophils Relative 0 %   Smear Review Criteria for review not met   HIV antibody  Result Value Ref Range   HIV 1&2 Ab, 4th Generation NONREACTIVE NONREACTIVE  Valproic Acid level  Result Value Ref Range   Valproic Acid Lvl <12.5 (L) 50.0 - 100.0 mg/L      Assessment & Plan:   Problem List Items Addressed This Visit    Migraine without aura and without status migrainosus, not intractable    Persistent stable to worsening chronic migraine HA, not active today. Inc frequency, uncertain exact  triggers or etiology - Currently without active HA, well-appearing, no focal neuro deficits, tolerating PO w/o n/v - Failed Topamax, not taking Depakote  Plan: 1. Offered triptan again - agree to hold off for now, this is next line for abortive therapy likely oral sumatriptan 24m per dose, will phone in if requested 2. Continue with Ibuprofen 600-8039mPRN infrequent dosing ONLY abortive, also reviewed Excedrin Migraine PRN use 3. Tylenol PRN breakthrough or minor headaches 4. Continue inc titration Gabapentin for prophylaxis now only 10035mightly, can inc to 300m16mghtly then may do TID dosing in future 5. Avoid triggers including foods, caffeine. Get good sleep 6. Again advised to start headache diary, identify triggers for avoidance, bring to next visit 7. Return criteria given for acute migraine, when to go to office vs ED - [facility] chronic migraines and also for history of neuropathy and problem with spine - requested in 12/2016, waiting on referral now  Future consider Amitriptyline, SNRI      Idiopathic scoliosis of lumbosacral spine    Stable chronic problem, lumbar levoscoliosis, seems to have some sequela for other areas of spine by report - some chronic pain and limitations - on variety of medications - Followed by chiropractor - Last X-rays C / T / L spine - w/o OA/DJD except mild Lumbar levoscoliosis  Plan: 1. Reassurance 2. Increase regular Tylenol, not adhering to this 3. Titrate up gabapentin as discussed 4. Ibuprofen high dose only PRN migraine 5. Continue regular exercise, in future may need referral formal PT or spine center if any concern with scoliosis, otherwise Neurology for other nerve symptoms      Chronic pain syndrome    Multiple etiology MSK generators for chronic pain, seems related to mild levoscoliosis and more muscular symptoms, recent x-rays do not support osteoarthritis or other injury -  Continue with conservative measures - Titrate up Gabapentin for chronic migraine prevention - Follow-up       Other Visit Diagnoses    Annual physical exam    -  Primary   Vertigo       Most consistent with BPPV based on history, could be related to  headaches / migraines, handout given on Epley maneuver, may try Meclizine PRN, follow-up      No orders of the defined types were placed in this encounter.   Follow up plan: Return in about 3 months (around 02/05/2017) for Migraines.  AlexNobie Putnam SAbandaup 11/06/2016, 2:56 PM

## 2016-11-06 NOTE — Assessment & Plan Note (Signed)
Multiple etiology MSK generators for chronic pain, seems related to mild levoscoliosis and more muscular symptoms, recent x-rays do not support osteoarthritis or other injury - Continue with conservative measures - Titrate up Gabapentin for chronic migraine prevention - Follow-up

## 2016-11-06 NOTE — Patient Instructions (Addendum)
Thank you for coming to the clinic today.  1. Blood work looks good. Keep up the good work 2. For Migraines, recommend to continue with either Ibuprofen 600 to 800mg  as abortive therapy may repeat dose 6-8 hours, OR can switch to Aleve 500mg  per dose every 12 hours for migraine - also try Excedrin migraine if needed 3. Keep increasing Gabapentin as discussed, may need multiple daily doses, up to 3 times daily for pain and migraine prevention - Call office in future if worsening migraines, we can send in the Imitrex (Sumatriptan) can take one dose then repeat in 2 hours if needed - Or we can change the preventative medicine as well  CALL FOR REFERRAL TO [facility] in October  Please schedule a Follow-up Appointment to: Return in about 3 months (around 02/05/2017) for Migraines.    You have symptoms of Vertigo (Benign Paroxysmal Positional Vertigo) - This is commonly caused by inner ear fluid imbalance, sometimes can be worsened by allergies and sinus symptoms, otherwise it can occur randomly sometimes and we may never discover the exact cause. - To treat this, try the Epley Manuever (see diagrams/instructions below) at home up to 3 times a day for 1-2 weeks or until symptoms resolve - You may take Meclizine as needed up to 3 times a day for dizziness, this will not cure symptoms but may help. Caution may make you drowsy.  If you develop significant worsening episode with vertigo that does not improve and you get severe headache, loss of vision, arm or leg weakness, slurred speech, or other concerning symptoms please seek immediate medical attention at Emergency Department.  See the next page for images describing the Epley Manuever.     ----------------------------------------------------------------------------------------------------------------------            If you have any other questions or concerns, please feel free to call the clinic or send a message  through MyChart. You may also schedule an earlier appointment if necessary.  Additionally, you may be receiving a survey about your experience at our clinic within a few days to 1 week by e-mail or mail. We value your feedback.  Saralyn PilarAlexander Karamalegos, DO [facility]

## 2016-11-11 ENCOUNTER — Other Ambulatory Visit: Payer: Self-pay | Admitting: Nurse Practitioner

## 2016-11-11 DIAGNOSIS — J45901 Unspecified asthma with (acute) exacerbation: Secondary | ICD-10-CM

## 2016-12-03 ENCOUNTER — Other Ambulatory Visit: Payer: Self-pay | Admitting: Family Medicine

## 2016-12-03 DIAGNOSIS — G43009 Migraine without aura, not intractable, without status migrainosus: Secondary | ICD-10-CM

## 2016-12-03 DIAGNOSIS — G629 Polyneuropathy, unspecified: Secondary | ICD-10-CM

## 2017-01-20 ENCOUNTER — Other Ambulatory Visit: Payer: Self-pay

## 2017-01-20 MED ORDER — OMEPRAZOLE 20 MG PO CPDR: 20.0000 mg | DELAYED_RELEASE_CAPSULE | Freq: Every day | ORAL | 1 refills | Status: AC

## 2017-02-09 ENCOUNTER — Ambulatory Visit: Payer: Self-pay | Admitting: Neurology

## 2017-02-26 ENCOUNTER — Other Ambulatory Visit: Payer: Self-pay

## 2017-02-26 ENCOUNTER — Encounter: Payer: Self-pay | Admitting: Family Medicine

## 2017-02-26 ENCOUNTER — Ambulatory Visit (INDEPENDENT_AMBULATORY_CARE_PROVIDER_SITE_OTHER): Payer: Medicare Other | Admitting: Family Medicine

## 2017-02-26 VITALS — BP 108/68 | HR 65 | Temp 98.3°F | Resp 16 | Ht 74.0 in | Wt 218.0 lb

## 2017-02-26 DIAGNOSIS — K219 Gastro-esophageal reflux disease without esophagitis: Secondary | ICD-10-CM | POA: Diagnosis not present

## 2017-02-26 DIAGNOSIS — Z23 Encounter for immunization: Secondary | ICD-10-CM | POA: Diagnosis not present

## 2017-02-26 DIAGNOSIS — N411 Chronic prostatitis: Secondary | ICD-10-CM

## 2017-02-26 DIAGNOSIS — F901 Attention-deficit hyperactivity disorder, predominantly hyperactive type: Secondary | ICD-10-CM

## 2017-02-26 DIAGNOSIS — J4540 Moderate persistent asthma, uncomplicated: Secondary | ICD-10-CM

## 2017-02-26 DIAGNOSIS — M41127 Adolescent idiopathic scoliosis, lumbosacral region: Secondary | ICD-10-CM | POA: Diagnosis not present

## 2017-02-26 DIAGNOSIS — F429 Obsessive-compulsive disorder, unspecified: Secondary | ICD-10-CM | POA: Diagnosis not present

## 2017-02-26 DIAGNOSIS — G43009 Migraine without aura, not intractable, without status migrainosus: Secondary | ICD-10-CM | POA: Diagnosis not present

## 2017-02-26 NOTE — Assessment & Plan Note (Signed)
Followed by Urology Continue flomax

## 2017-02-26 NOTE — Assessment & Plan Note (Signed)
Followed by Psych but may stop going as frequently Continue Prozac - discussed with patient that I would be ok prescribing this medication Discussed that I will not Rx chronic benzos He will discuss benzos with Psych

## 2017-02-26 NOTE — Assessment & Plan Note (Signed)
Well controlled Continue PPI 

## 2017-02-26 NOTE — Assessment & Plan Note (Signed)
Stable and chronic problem States he has chronic limitations and pain that is related Last XRays reviewed - no OA/DJD, mild lumbar levoscoliosis Reassurance given Ok to continue tylenol/ibuprofen prn Could consider PT in the future

## 2017-02-26 NOTE — Progress Notes (Signed)
Patient: Phillip Buckley, Male    DOB: [birth year], 31 y.o.   MRN: [id number] Visit Date: 02/26/2017  Today's Provider: Shirlee LatchAngela Rajah Lamba, MD   I, Joslyn HyEmily Ratchford, CMA, am acting as scribe for Shirlee LatchAngela Cadden Elizondo, MD.  Chief Complaint  Patient presents with  . Establish Care   Subjective:    Establish Care Phillip Buckley is a 31 y.o. male who presents today for health maintenance and to establish care. Phillip Buckley feels poorly. Phillip Buckley reports exercising. Phillip Buckley walks. Phillip Buckley reports Phillip Buckley is sleeping well.  Phillip Buckley has a H/O scoliosis. Phillip Buckley is on disability because of this, as well as asthma.   Depression/Anxiety/OCD: Previously followed by Psychiatry.  Psychiatrist moved to [place].  Would like to transfer Prozac Rx to PCP, away from Psych.  States Phillip Buckley has had depression since age 31.  Was able to come off of medications for a few years.  When grandfather passed, Phillip Buckley was put back on it.  OCD tendencies also started during that stressful time.  Seems to help OCD tendencies. Has been on Xanax for years daily.  Anxiety control varies day to day.  This was being managed by PCP.    Migraines: Previously prescribed prn Depakote. Also takes prn Tylenol and ibuprofen.    ADHD: Stopped taking Aderall due to palpitations.  Was stopped by PCP ~6354yrs ago for this reason.  Patient would like to resume.  States Phillip Buckley was 5010-31 years old when Phillip Buckley was diagnosed with ADHD.  Never tried any medications other than Adderall.  Without Adderall, having difficulty focusing on reading, filling out paperwork.  Able to focus on things that Phillip Buckley enjoys doing, such as wood work.  Followed by Urology (Dr. Achilles Dunkope) for LUTS: States this is caused by scoliosis pressing on bladder.  Flomax seems to help with this.  Asthma: Reportedly well controlled on Advair, Symbicort, prn ProAir.  Worse in the spring due to allergy symptoms.  Also taking Zyrtec.  With exertion (helping friends move) will have to take ProAir.  Last asthma exacerbation ~1 yr ago.   Never been hospitalized or intubated for asthma exacerbation.  GERD: reports Phillip Buckley is well controlled on daily Prilosec -----------------------------------------------------------------   Review of Systems  Constitutional: Positive for fatigue. Negative for activity change, appetite change, chills, diaphoresis, fever and unexpected weight change.  HENT: Positive for congestion, ear discharge and trouble swallowing. Negative for dental problem, drooling, ear pain, facial swelling, hearing loss, mouth sores, nosebleeds, postnasal drip, rhinorrhea, sinus pressure, sinus pain, sneezing, sore throat, tinnitus and voice change.   Eyes: Negative.   Respiratory: Positive for wheezing. Negative for apnea, cough, choking, chest tightness, shortness of breath and stridor.   Cardiovascular: Positive for chest pain. Negative for palpitations and leg swelling.  Gastrointestinal: Positive for abdominal pain and constipation. Negative for abdominal distention, anal bleeding, blood in stool, diarrhea, nausea, rectal pain and vomiting.  Endocrine: Positive for polydipsia and polyuria. Negative for cold intolerance, heat intolerance and polyphagia.  Genitourinary: Positive for frequency and urgency. Negative for decreased urine volume, difficulty urinating, discharge, dysuria, enuresis, flank pain, genital sores, hematuria, penile pain, penile swelling, scrotal swelling and testicular pain.  Musculoskeletal: Positive for arthralgias, back pain, myalgias, neck pain and neck stiffness. Negative for gait problem and joint swelling.  Skin: Negative.   Allergic/Immunologic: Negative.   Neurological: Positive for dizziness, light-headedness and headaches. Negative for tremors, seizures, syncope, facial asymmetry, speech difficulty, weakness and numbness.  Hematological: Negative.   Psychiatric/Behavioral: Positive for agitation and dysphoric mood.  Negative for behavioral problems, confusion, decreased concentration,  hallucinations, self-injury, sleep disturbance and suicidal ideas. The patient is nervous/anxious and is hyperactive.     Social History      Phillip Buckley  reports that  has never smoked. Phillip Buckley has never used smokeless tobacco. Phillip Buckley reports that Phillip Buckley drinks alcohol. Phillip Buckley reports that Phillip Buckley does not use drugs.       Social History   Socioeconomic History  . Marital status: Single    Spouse name: None  . Number of children: 0  . Years of education: None  . Highest education level: 9th grade  Social Needs  . Financial resource strain: None  . Food insecurity - worry: None  . Food insecurity - inability: None  . Transportation needs - medical: None  . Transportation needs - non-medical: None  Occupational History    Employer: DISABLED  Tobacco Use  . Smoking status: Never Smoker  . Smokeless tobacco: Never Used  Substance and Sexual Activity  . Alcohol use: Yes    Alcohol/week: 0.0 oz    Comment: occasional  . Drug use: No  . Sexual activity: No  Other Topics Concern  . None  Social History Narrative  . None    Past Medical History:  Diagnosis Date  . ADHD   . ADHD (attention deficit hyperactivity disorder)   . Allergy   . Asthma   . Back pain   . Depression   . GERD (gastroesophageal reflux disease)   . Migraines   . OCD (obsessive compulsive disorder)   . OCD (obsessive compulsive disorder)   . Scoliosis      Patient Active Problem List   Diagnosis Date Noted  . Neuropathy 12/03/2016  . Migraine without aura and without status migrainosus, not intractable 10/28/2016  . Strain of groin 09/25/2015  . Asthma 07/04/2015  . Chronic pain syndrome 07/04/2015  . OCD (obsessive compulsive disorder) 07/04/2015  . GERD (gastroesophageal reflux disease) 07/04/2015  . ADHD (attention deficit hyperactivity disorder) 07/04/2015  . Idiopathic scoliosis of lumbosacral spine 06/14/2014  . 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 Renal colic 12/06/2013  . Chronic prostatitis 10/26/2013  . Cystitis  10/26/2013  . Left groin pain 10/26/2013    Past Surgical History:  Procedure Laterality Date  . FINGER SURGERY Right    index; pins placed  . TONSILLECTOMY AND ADENOIDECTOMY  1994    Family History        Family Status  Relation Name Status  . Mother  Alive  . Unknown  (Not Specified)  . Unknown  (Not Specified)  . Mat Aunt  (Not Specified)  . MGM  (Not Specified)  . Father  (Not Specified)  . MGF  (Not Specified)  . PGM  (Not Specified)  . PGF  (Not Specified)  . Neg Hx  (Not Specified)        His family history includes Cancer - Colon (age of onset: 4470) in his unknown relative; Colon cancer in his paternal grandfather; Diabetes in his maternal aunt, maternal grandfather, and maternal grandmother; Diabetes Mellitus II in his unknown relative; Hypertension in his father; Kidney disease in his maternal grandfather; Osteoporosis in his maternal grandmother, mother, and paternal grandmother; Sleep apnea in his maternal grandmother.     Allergies  Allergen Reactions  . Benadryl [Diphenhydramine Hcl]     Other reaction(s): Unknown  . Codeine Phosphate [Codeine]   . Penicillins      Current Outpatient Medications:  .  acetaminophen (TYLENOL) 500 MG tablet, Take 500  mg by mouth every 6 (six) hours as needed., Disp: , Rfl:  .  ALPRAZolam (XANAX) 0.5 MG tablet, Take 0.5 mg by mouth daily., Disp: , Rfl: 0 .  budesonide-formoterol (SYMBICORT) 160-4.5 MCG/ACT inhaler, Inhale 2 puffs into the lungs 2 (two) times daily., Disp: 1 Inhaler, Rfl: 12 .  cetirizine (ZYRTEC) 10 MG tablet, Take 10 mg by mouth daily., Disp: , Rfl:  .  cyclobenzaprine (FLEXERIL) 10 MG tablet, Take 10 mg by mouth daily., Disp: , Rfl:  .  divalproex (DEPAKOTE ER) 500 MG 24 hr tablet, Take 500 mg by mouth daily as needed. , Disp: , Rfl:  .  FLUoxetine HCl 60 MG TABS, Take 1 tablet by mouth every morning., Disp: , Rfl: 0 .  Fluticasone-Salmeterol (ADVAIR DISKUS) 250-50 MCG/DOSE AEPB, Inhale 1 puff into the lungs  daily., Disp: , Rfl:  .  ibuprofen (ADVIL,MOTRIN) 600 MG tablet, Take 1 tablet (600 mg total) by mouth every 8 (eight) hours as needed., Disp: 30 tablet, Rfl: 1 .  omeprazole (PRILOSEC) 20 MG capsule, Take 1 capsule (20 mg total) by mouth daily., Disp: 90 capsule, Rfl: 1 .  PROAIR HFA 108 (90 Base) MCG/ACT inhaler, inhale 2 puffs by mouth every 6 hours if needed for wheezing or shortness of breath **PLEASE MAKE APPT FOR MORE REFILLS**, Disp: 8.5 g, Rfl: 5 .  tamsulosin (FLOMAX) 0.4 MG CAPS capsule, Take 0.4 mg by mouth daily., Disp: , Rfl: 0   Patient Care Team: Erasmo Downer, MD as PCP - General (Family Medicine)      Objective:   Vitals: BP 108/68 (BP Location: Left Arm, Patient Position: Sitting, Cuff Size: Normal)   Pulse 65   Temp 98.3 F (36.8 C) (Oral)   Resp 16   Ht 6\' 2"  (1.88 m)   Wt 218 lb (98.9 kg)   SpO2 98%   BMI 27.99 kg/m    Vitals:   02/26/17 0925  BP: 108/68  Pulse: 65  Resp: 16  Temp: 98.3 F (36.8 C)  TempSrc: Oral  SpO2: 98%  Weight: 218 lb (98.9 kg)  Height: 6\' 2"  (1.88 m)     Physical Exam  Constitutional: Phillip Buckley is oriented to person, place, and time. Phillip Buckley appears well-developed and well-nourished. No distress.  HENT:  Head: Normocephalic and atraumatic.  Right Ear: External ear normal.  Left Ear: External ear normal.  Nose: Nose normal.  Mouth/Throat: Oropharynx is clear and moist. No oropharyngeal exudate.  Eyes: Conjunctivae are normal. Pupils are equal, round, and reactive to light. No scleral icterus.  Neck: Neck supple. No thyromegaly present.  Cardiovascular: Normal rate, regular rhythm, normal heart sounds and intact distal pulses.  No murmur heard. Pulmonary/Chest: Effort normal and breath sounds normal. No respiratory distress. Phillip Buckley has no wheezes. Phillip Buckley has no rales.  Abdominal: Soft. Bowel sounds are normal. Phillip Buckley exhibits no distension. There is no tenderness. There is no rebound.  Musculoskeletal: Phillip Buckley exhibits no edema.  +scoliosis    Lymphadenopathy:    Phillip Buckley has no cervical adenopathy.  Neurological: Phillip Buckley is alert and oriented to person, place, and time.  Skin: Skin is warm and dry. No rash noted.  Psychiatric: Phillip Buckley has a normal mood and affect. His behavior is normal.  Vitals reviewed.    Depression Screen PHQ 2/9 Scores 02/26/2017 11/06/2016 09/25/2015 07/05/2015  PHQ - 2 Score 3 - 2 3  PHQ- 9 Score 9 - 6 5  Exception Documentation - Patient refusal - -      Assessment & Plan:  Problem List Items Addressed This Visit      Cardiovascular and Mediastinum   Migraine without aura and without status migrainosus, not intractable    Currently without headaches for >1 month Discussed that should not use Depakote prn and needs to be d/c'd Can use tylenol or ibuprofen prn (patient talks about not wanting to take medications due to family h/o CKD but Phillip Buckley has no CKD and family CKD seems to be related to T2DM) Previously failed topamax and gabapentin If recur and needs ppx, could consider SNRI, amitriptyline      Relevant Medications   acetaminophen (TYLENOL) 500 MG tablet     Respiratory   Asthma - Primary    Well controlled No previous hospitalizations or intubation Continue advair and symbicort Ok to use ProAir prn        Digestive   GERD (gastroesophageal reflux disease)    Well controlled Continue PPI        Musculoskeletal and Integument   Idiopathic scoliosis of lumbosacral spine    Stable and chronic problem States Phillip Buckley has chronic limitations and pain that is related Last XRays reviewed - no OA/DJD, mild lumbar levoscoliosis Reassurance given Ok to continue tylenol/ibuprofen prn Could consider PT in the future        Genitourinary   Chronic prostatitis    Followed by Urology Continue flomax        Other   OCD (obsessive compulsive disorder)    Followed by Psych but may stop going as frequently Continue Prozac - discussed with patient that I would be ok prescribing this  medication Discussed that I will not Rx chronic benzos Phillip Buckley will discuss benzos with Psych      ADHD (attention deficit hyperactivity disorder)    Was previously unable to tolerate Adderall 2/2 palpitations Discussed with patient that we will not resume As Phillip Buckley is not currently working or in school and Phillip Buckley is able to focus when Phillip Buckley wants to do a task, not urgent need Phillip Buckley will discuss with Psychiatry further       Other Visit Diagnoses    Influenza vaccine needed       Relevant Orders   Flu Vaccine QUAD 6+ mos PF IM (Fluarix Quad PF) (Completed)       Return in about 3 months (around 05/27/2017) for depression f/u.  --------------------------------------------------------------------  The entirety of the information documented in the History of Present Illness, Review of Systems and Physical Exam were personally obtained by me. Portions of this information were initially documented by Irving Burton Ratchford, CMA and reviewed by me for thoroughness and accuracy.    Approximately 45 minutes was spent in discussion of which greater than 50% was consultation.    Erasmo Downer, MD, MPH [facility] 02/26/2017 11:21 AM

## 2017-02-26 NOTE — Assessment & Plan Note (Signed)
Currently without headaches for >1 month Discussed that should not use Depakote prn and needs to be d/c'd Can use tylenol or ibuprofen prn (patient talks about not wanting to take medications due to family h/o CKD but he has no CKD and family CKD seems to be related to T2DM) Previously failed topamax and gabapentin If recur and needs ppx, could consider SNRI, amitriptyline

## 2017-02-26 NOTE — Assessment & Plan Note (Signed)
Well controlled No previous hospitalizations or intubation Continue advair and symbicort Ok to use ProAir prn

## 2017-02-26 NOTE — Assessment & Plan Note (Signed)
Was previously unable to tolerate Adderall 2/2 palpitations Discussed with patient that we will not resume As he is not currently working or in school and he is able to focus when he wants to do a task, not urgent need He will discuss with Psychiatry further

## 2017-05-27 ENCOUNTER — Ambulatory Visit: Payer: Self-pay | Admitting: Family Medicine

## 2017-06-10 ENCOUNTER — Telehealth: Payer: Self-pay | Admitting: Family Medicine

## 2017-06-10 NOTE — Telephone Encounter (Signed)
[facility] on 12[facility]27.2018

## 2017-06-15 ENCOUNTER — Other Ambulatory Visit: Payer: Self-pay

## 2017-06-15 DIAGNOSIS — J45901 Unspecified asthma with (acute) exacerbation: Secondary | ICD-10-CM

## 2017-06-15 MED ORDER — ALBUTEROL SULFATE HFA 108 (90 BASE) MCG/ACT IN AERS: INHALATION_SPRAY | RESPIRATORY_TRACT | 2 refills | Status: AC

## 2017-11-13 ENCOUNTER — Telehealth: Payer: Self-pay | Admitting: Family Medicine

## 2017-11-13 NOTE — Telephone Encounter (Signed)
I called the patient to schedule AWV with [facility].  There was no answer and no option to leave a message because the voicemail hasn’t been set up. VDM (DD) °

## 2017-12-06 ENCOUNTER — Other Ambulatory Visit: Payer: Self-pay | Admitting: Family Medicine

## 2017-12-06 DIAGNOSIS — J45901 Unspecified asthma with (acute) exacerbation: Secondary | ICD-10-CM

## 2018-03-12 IMAGING — DX DG THORACIC SPINE 3V
4 series · 4 of 4 positions shown · non-contrast
Comparison: None.

CLINICAL DATA: Chronic back pain.

EXAM:
THORACIC SPINE - 3 VIEWS

[t-spine ap (1 of 2)]
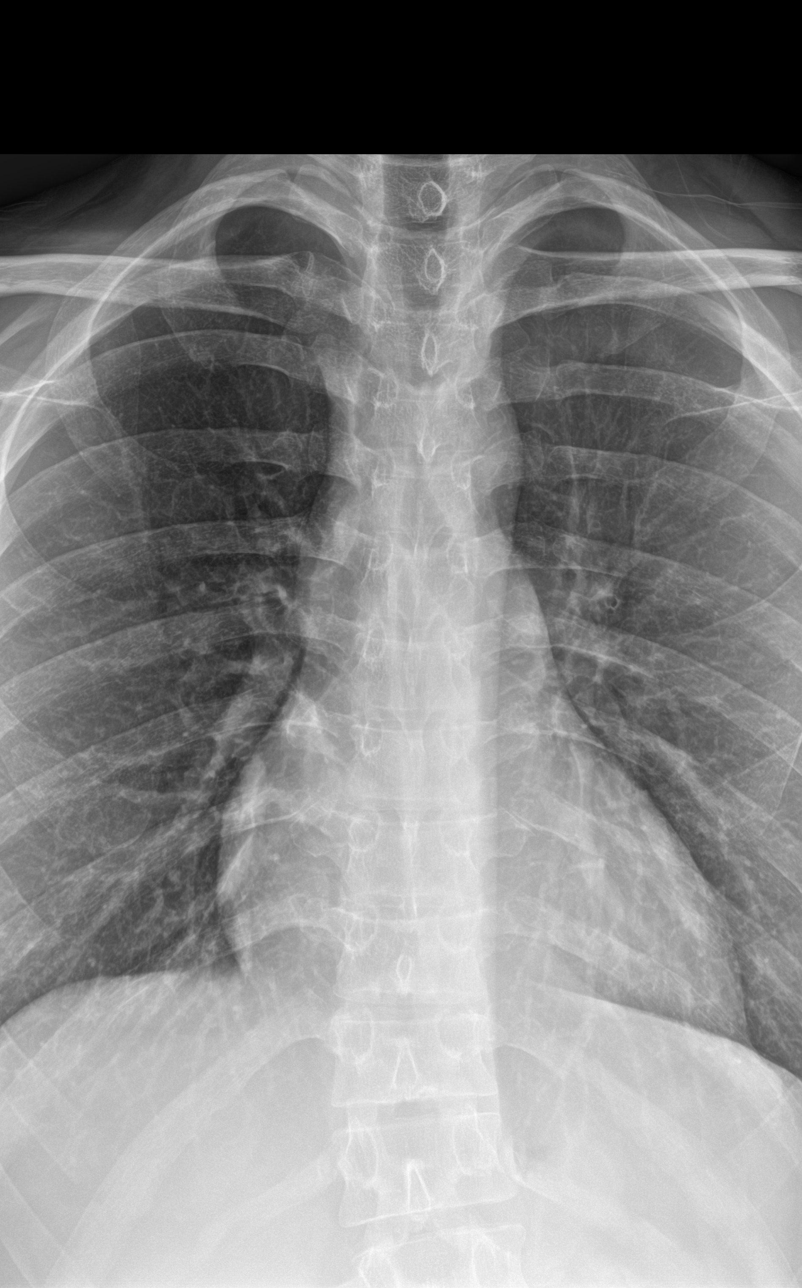

[t-spine lat]
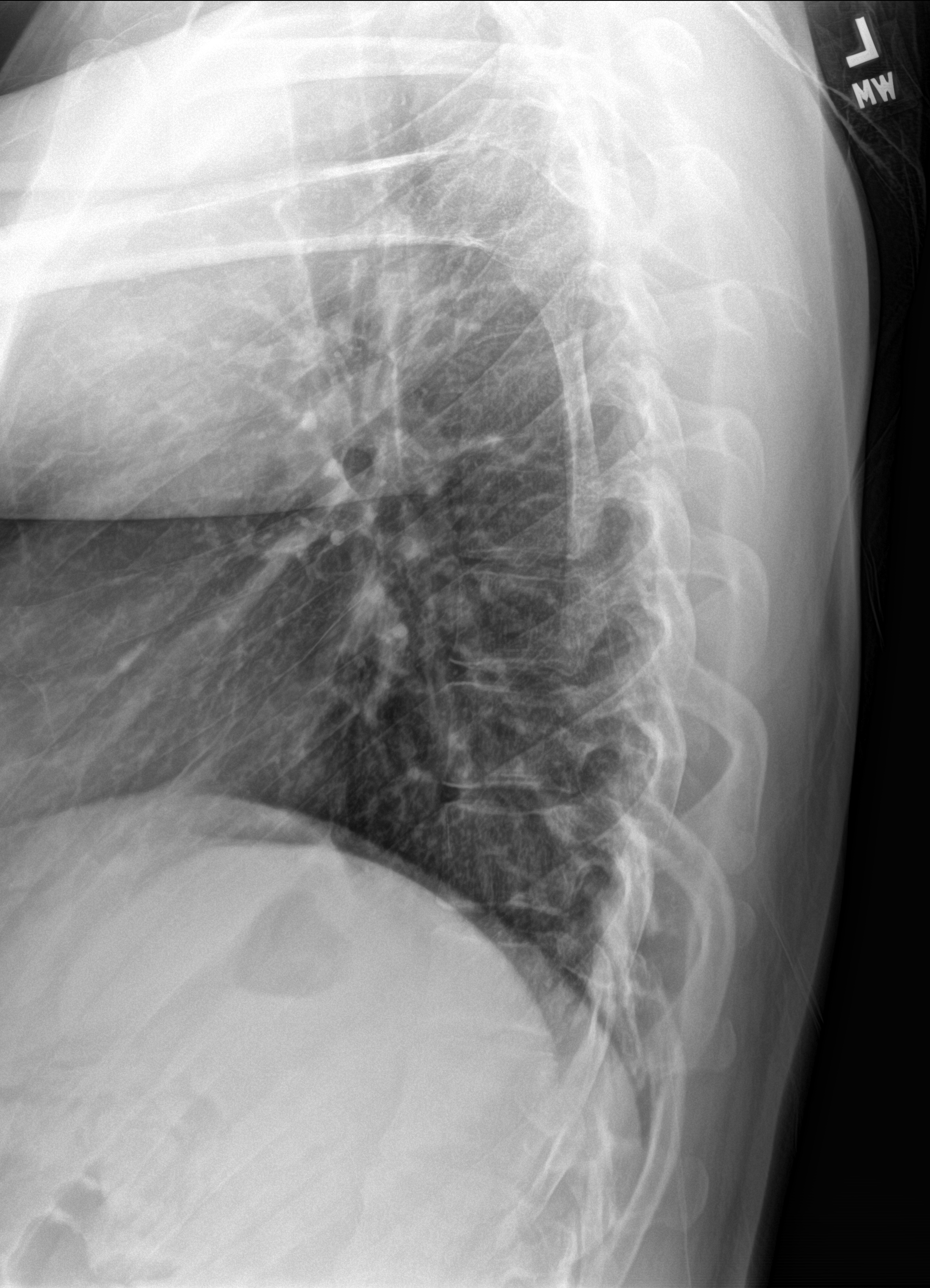

[t-spine swimmers]
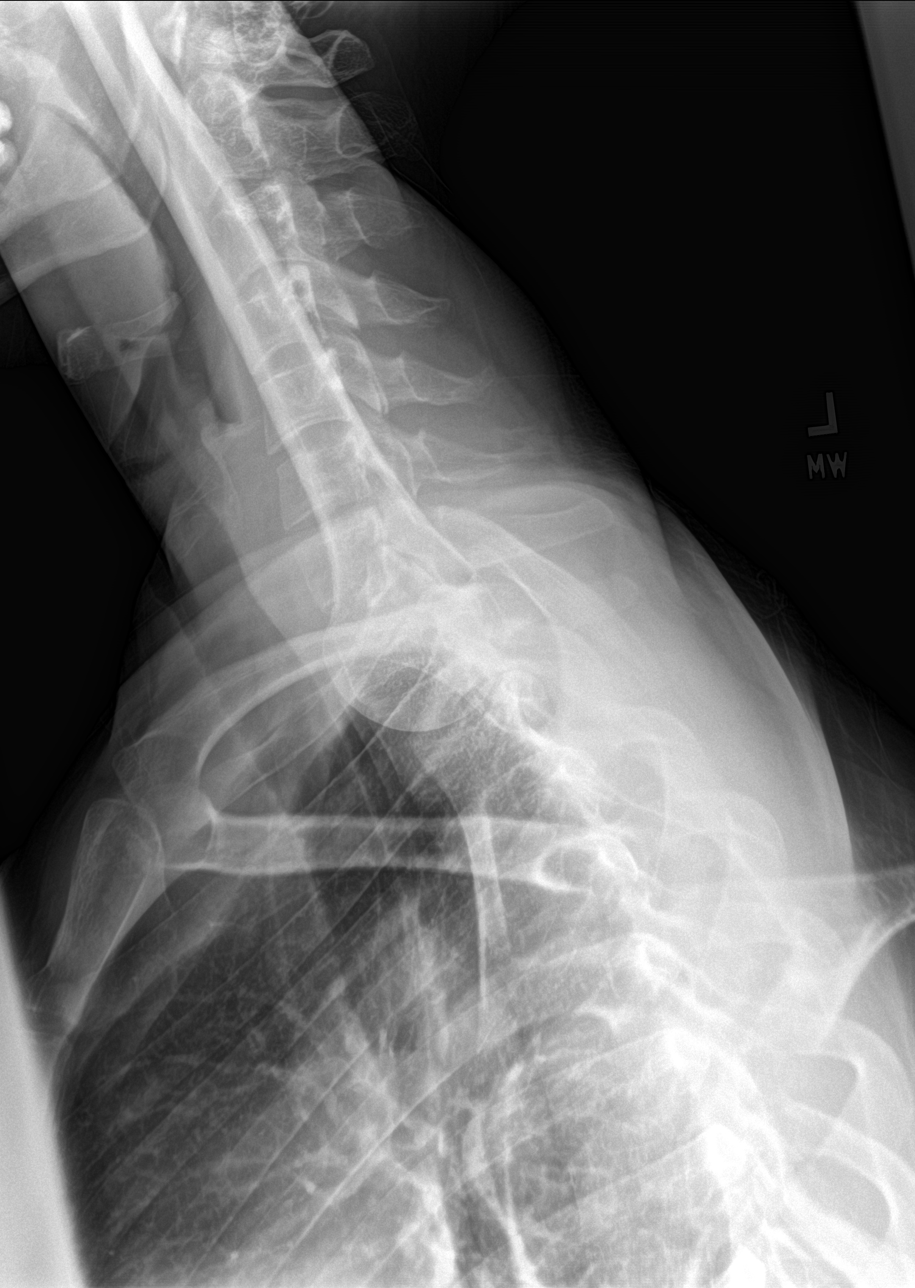

[t-spine ap (2 of 2)]
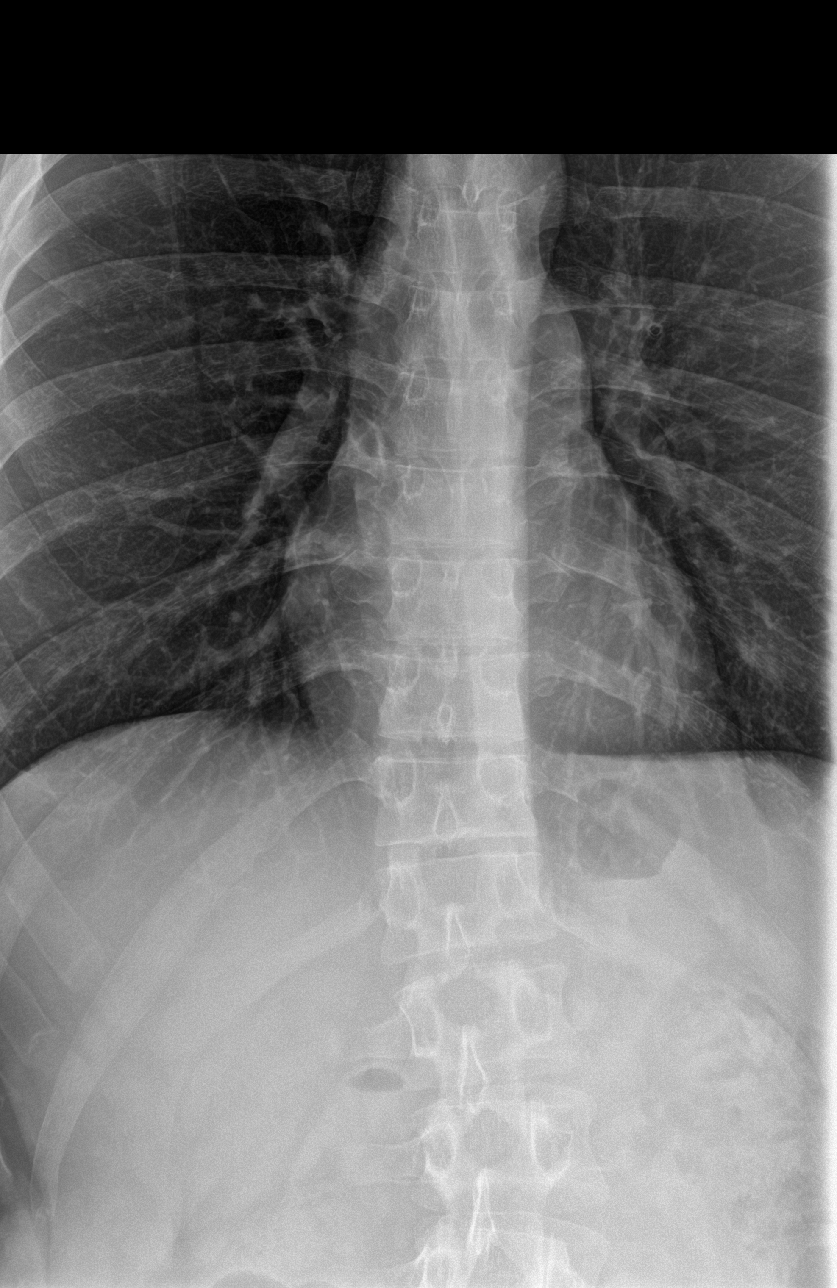

[4 of 4 positions shown; findings below may reference images not displayed]

FINDINGS: There is no evidence of thoracic spine fracture. Alignment is
normal. No other significant bone abnormalities are identified.
IMPRESSION: Negative.

## 2018-03-12 IMAGING — DX DG LUMBAR SPINE COMPLETE 4+V
6 series · 6 of 6 positions shown · non-contrast
Comparison: None.

CLINICAL DATA: Chronic back pain

EXAM:
LUMBAR SPINE - COMPLETE 4+ VIEW

[l-spine ap]
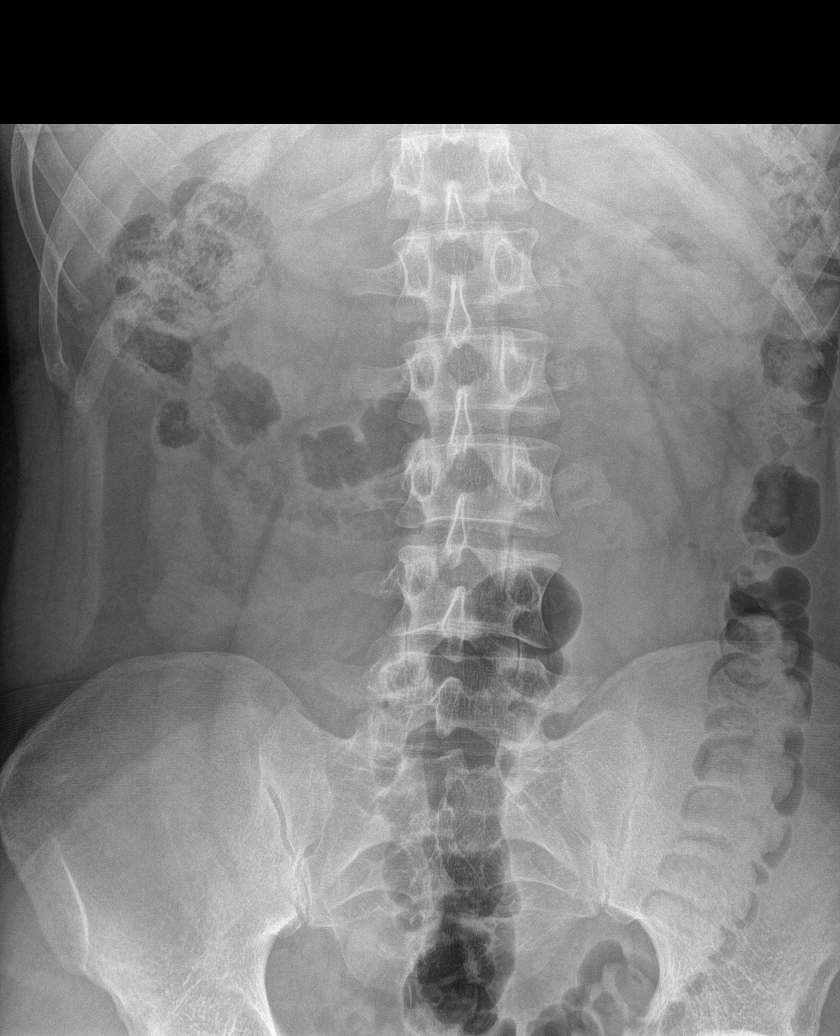

[l-spine obl (1 of 3)]
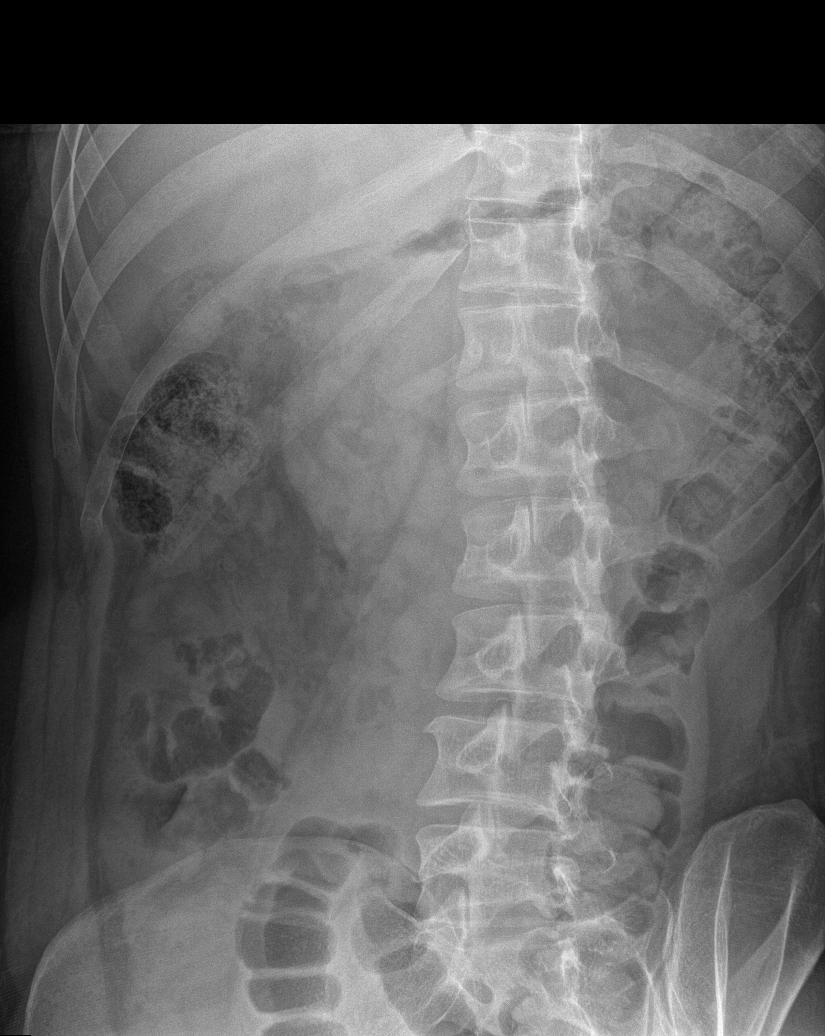

[l-spine obl (2 of 3)]
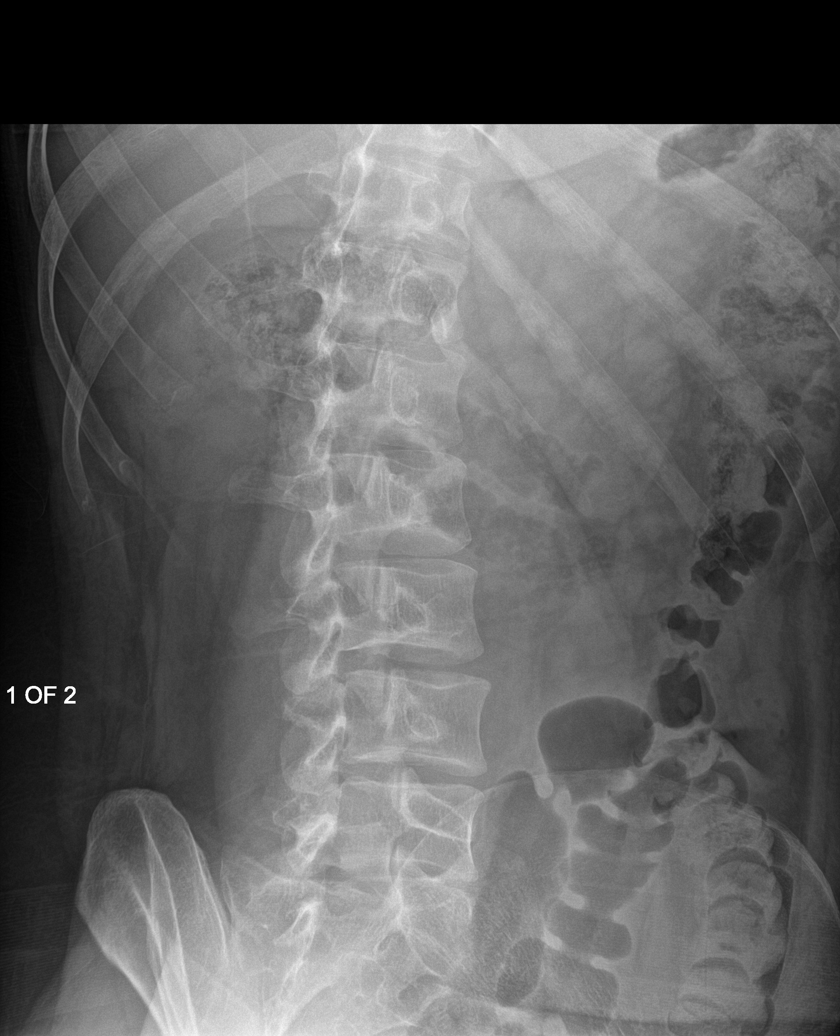

[l-spine lat]
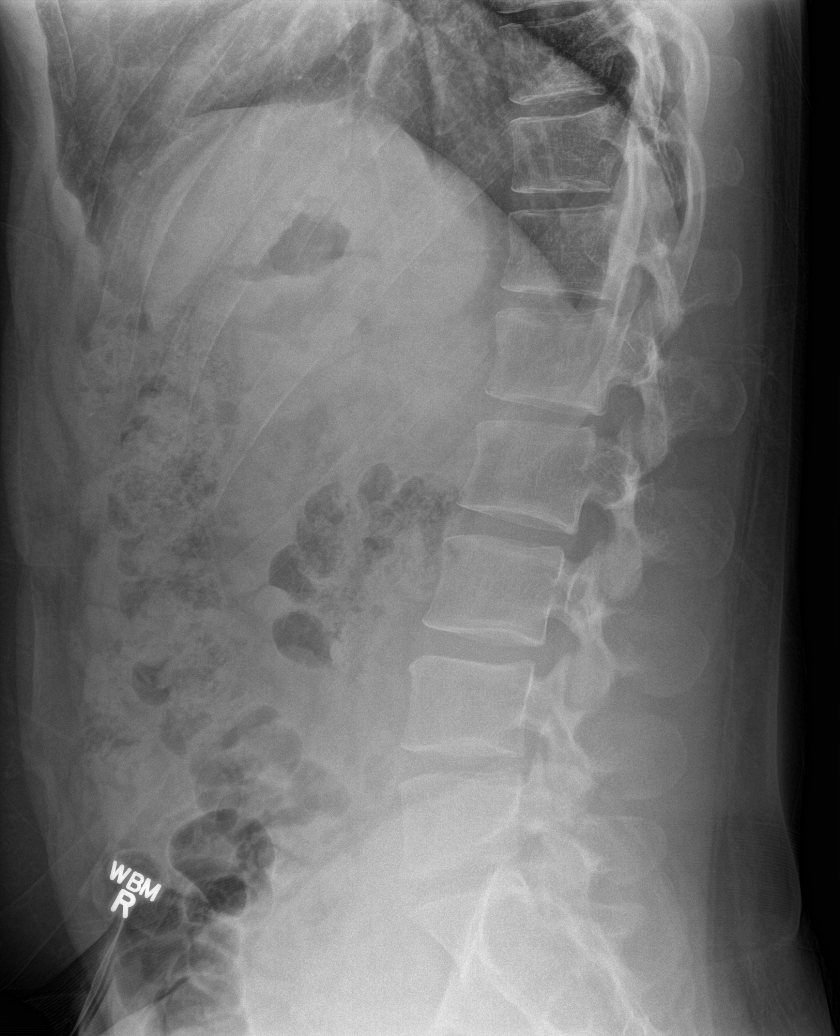

[l-spine spot]
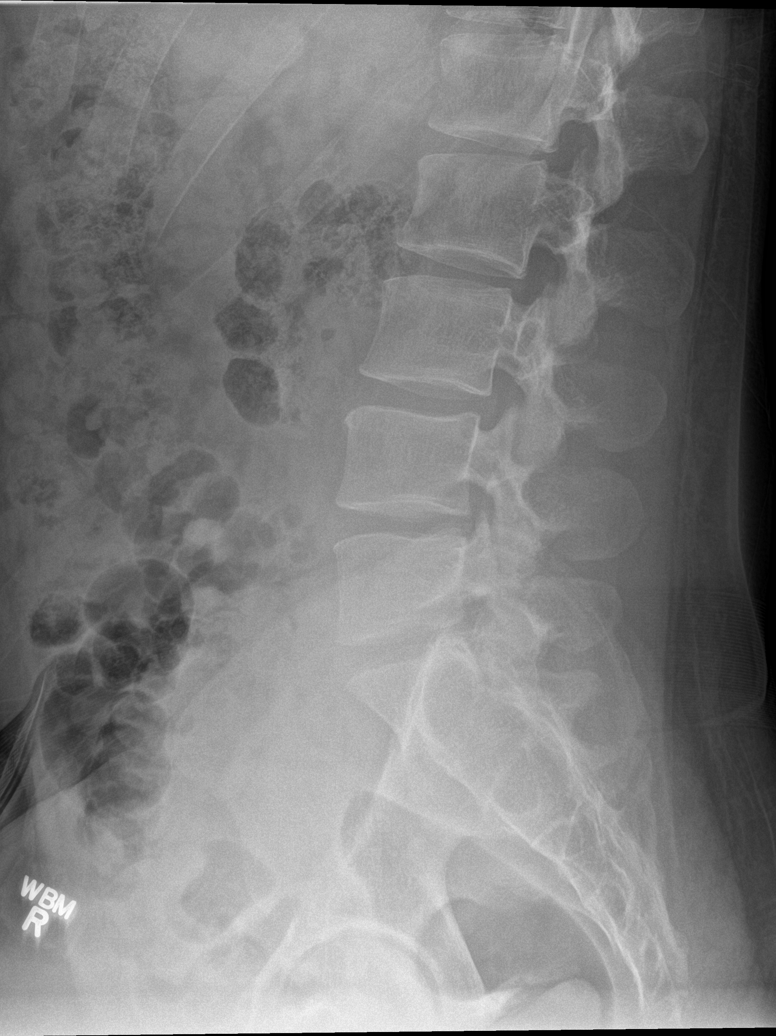

[l-spine obl (3 of 3)]
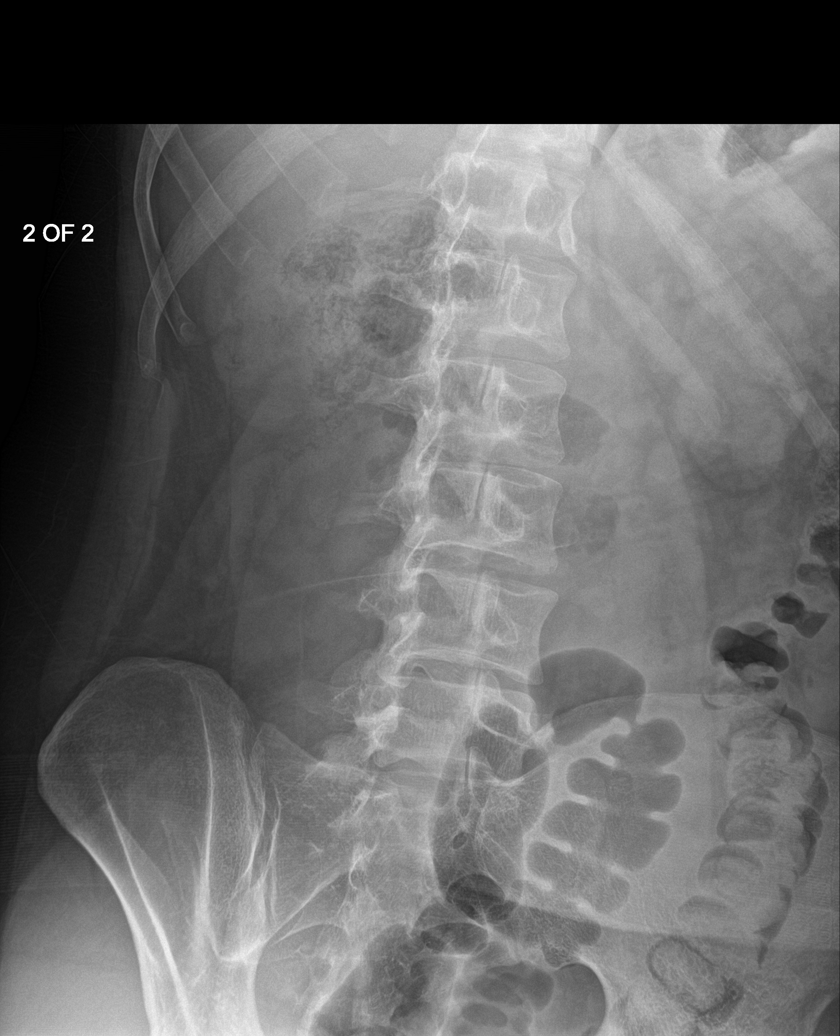

[6 of 6 positions shown; findings below may reference images not displayed]

FINDINGS: Minimal leftward scoliosis in the lumbar spine. No fracture. Disc
spaces are maintained. SI joints are symmetric and unremarkable.
IMPRESSION: No acute bony abnormality.

## 2019-05-07 DIAGNOSIS — L700 Acne vulgaris: Secondary | ICD-10-CM

## 2019-05-07 DIAGNOSIS — L709 Acne, unspecified: Secondary | ICD-10-CM | POA: Insufficient documentation

## 2019-05-25 ENCOUNTER — Other Ambulatory Visit: Payer: Self-pay

## 2019-05-25 ENCOUNTER — Ambulatory Visit (INDEPENDENT_AMBULATORY_CARE_PROVIDER_SITE_OTHER): Payer: Medicare Other | Admitting: Dermatology

## 2019-05-25 DIAGNOSIS — L700 Acne vulgaris: Secondary | ICD-10-CM | POA: Diagnosis not present

## 2019-05-25 NOTE — Progress Notes (Signed)
   Follow-Up Visit   Subjective  Phillip Buckley is a 34 y.o. male who presents for the following: Acne (54m f/u, face and chest [place] 0.1% cr qhs, pt did not start Isotretinoin due to potential se, pt has scoliosis and is primary driver for mother concerned about vision changes and bone health with Isotretinoin ).   The following portions of the chart were reviewed this encounter and updated as appropriate:     Review of Systems: No other skin or systemic complaints.  Objective  Well appearing patient in no apparent distress; mood and affect are within normal limits.  A focused examination was performed including face, chest. Relevant physical exam findings are noted in the Assessment and Plan.  Objective  face, chest: Active papulonodules mandible, deep large closed comedones entire face, some scarring   Assessment & Plan  Acne vulgaris face, chest  With scarring  Detailed discussion Isotretinoin and s/e and best txt currently for pt  Start Isotretinoin 20mg  1 po qd with fatty meal (pt has perscription that was filled from 04/28/19)    Return in about 1 month (around 06/25/2019) for Isotretinoin.

## 2019-06-27 ENCOUNTER — Ambulatory Visit (INDEPENDENT_AMBULATORY_CARE_PROVIDER_SITE_OTHER): Payer: Medicare Other | Admitting: Dermatology

## 2019-06-27 ENCOUNTER — Encounter: Payer: Self-pay | Admitting: Dermatology

## 2019-06-27 ENCOUNTER — Other Ambulatory Visit: Payer: Self-pay

## 2019-06-27 VITALS — Wt 250.0 lb

## 2019-06-27 DIAGNOSIS — Z79899 Other long term (current) drug therapy: Secondary | ICD-10-CM

## 2019-06-27 DIAGNOSIS — L700 Acne vulgaris: Secondary | ICD-10-CM

## 2019-06-27 MED ORDER — ISOTRETINOIN 30 MG PO CAPS: 30.0000 mg | ORAL_CAPSULE | Freq: Every day | ORAL | 0 refills | Status: AC

## 2019-06-27 NOTE — Patient Instructions (Addendum)
Recommend daily broad spectrum sunscreen SPF 30+ to sun-exposed areas, reapply every 2 hours as needed. Call for new or changing lesions.   While taking isotretinoin, do not share pills and do not donate blood. Isotretinoin is best absorbed when taken with a fatty meal. Isotretinoin can make you sensitive to the sun. Daily careful sun protection including sunscreen SPF 30+ when outdoors is recommended.  Patient confirmed in iPledge and isotretinoin sent to pharmacy.

## 2019-06-27 NOTE — Progress Notes (Signed)
   Isotretinoin Follow-Up Visit   Subjective  Phillip Buckley is a 34 y.o. male who presents for the following: Acne (4weeks Isotretinoin 20mg , Reports having no side effects, Acne seems to be better and not as bright, inflammation seems to be gone, what was there before isotreteinoin has come to a head and have gone away.. using soap and water to wash face daily. ).  Week # 4    Side effects: Reports no side effects  Denies changes in night vision, shortness of breath, abdominal pain, nausea, vomiting, diarrhea, blood in stool or urine, visual changes, headaches, epistaxis, joint pain, myalgias, mood changes, depression, or suicidal ideation.   The following portions of the chart were reviewed this encounter and updated as appropriate: medications, allergies, medical history  Review of Systems:  No other skin or systemic complaints except as noted in HPI or Assessment and Plan.  Objective  Well appearing patient in no apparent distress; mood and affect are within normal limits.  An examination of the face, neck, chest, and back was performed and relevant findings are noted below.   Objective  Head - Anterior (Face): Less red, Active papulonodules mandible, deep large closed comedones entire face, some scarring with scarring      Assessment & Plan   Acne vulgaris Head - Anterior (Face)  Will increase to 30mg  QD take with a fatty meal  While taking isotretinoin, do not share pills and do not donate blood. Isotretinoin is best absorbed when taken with a fatty meal. Isotretinoin can make you sensitive to the sun. Daily careful sun protection including sunscreen SPF 30+ when outdoors is recommended. 3  Patient confirmed in iPledge and isotretinoin sent to pharmacy.   ISOtretinoin (ACCUTANE) 30 MG capsule - Head - Anterior (Face)  Isotretinoin F/U - 06/27/19 1300      Isotretinoin Follow Up   iPledge #     Date  06/27/19    Weight  250 lb (113.4 kg)    Acne  breakouts since last visit?  No      Side Effects   Skin  WNL    Gastrointestinal  WNL    Neurological  WNL    Constitutional  WNL      While taking Isotretinoin, do note share pills, and for 30 days after you finish the medication, do not donate blood. Isotretinoin is best absorbed when taken with a fatty meal. Isotretinoin can make you sensitive to the sun. Daily careful sun protection including sunscreen SPF 30+ when outdoors is recommended.  Follow-up in 30 days.  [phone], CMA, am acting as scribe for 06/29/19, MD   Documentation: I have reviewed the above documentation for accuracy and completeness, and I agree with the above.  Allen Norris, MD

## 2019-06-28 ENCOUNTER — Encounter: Payer: Self-pay | Admitting: Dermatology

## 2019-07-28 ENCOUNTER — Other Ambulatory Visit: Payer: Self-pay

## 2019-07-28 ENCOUNTER — Ambulatory Visit (INDEPENDENT_AMBULATORY_CARE_PROVIDER_SITE_OTHER): Payer: Medicare Other | Admitting: Dermatology

## 2019-07-28 VITALS — Wt 250.0 lb

## 2019-07-28 DIAGNOSIS — Z79899 Other long term (current) drug therapy: Secondary | ICD-10-CM | POA: Diagnosis not present

## 2019-07-28 DIAGNOSIS — L853 Xerosis cutis: Secondary | ICD-10-CM

## 2019-07-28 DIAGNOSIS — L700 Acne vulgaris: Secondary | ICD-10-CM | POA: Diagnosis not present

## 2019-07-28 DIAGNOSIS — K130 Diseases of lips: Secondary | ICD-10-CM

## 2019-07-28 MED ORDER — ISOTRETINOIN 40 MG PO CAPS: ORAL_CAPSULE | ORAL | 0 refills | Status: DC

## 2019-07-28 NOTE — Progress Notes (Signed)
   Follow-Up Visit   Subjective  Phillip Buckley is a 34 y.o. male who presents for the following: Acne (Wk 8 Isotretinoin - patient is currently on 30mg  and tolerating well other than occasional dryness of the lips and skin).  The following portions of the chart were reviewed this encounter and updated as appropriate:  Tobacco  Allergies  Meds  Problems  Med Hx  Surg Hx  Fam Hx     Review of Systems:  No other skin or systemic complaints except as noted in HPI or Assessment and Plan.  Objective  Well appearing patient in no apparent distress; mood and affect are within normal limits.  A focused examination was performed including the face. Relevant physical exam findings are noted in the Assessment and Plan.  Objective  Face, chest, back: Deep closed comedones and a few papules   Assessment & Plan  Acne vulgaris - severe - on Isotretinoin week # 8 Face, chest, back  WK 8, Improving - Increase Isotretinoin to 40mg  po QD take with food. #30 0RF  ISOtretinoin (ACCUTANE) 40 MG capsule - Face, chest, back  Isotretinoin F/U - 07/28/19 0900      Isotretinoin Follow Up   iPledge #     Date  07/28/19    Weight  250 lb (113.4 kg)    Acne breakouts since last visit?  Yes      Side Effects   Skin  Chapped Lips;Dry Skin    Gastrointestinal  WNL    Neurological  WNL    Constitutional  WNL       Cheilitis secondary to Isotretinoin  - Continue lip balm as directed, Dr. [phone] Cortibalm or Aquaphor recommended  Xerosis secondary to Isotretinoin - diffuse xerotic patches - recommend gentle, hydrating skin care - gentle skin care handout given  Return in about 1 month (around 08/28/2019).   Clayborne Artist, CMA, am acting as scribe for 08/30/2019, MD .  Documentation: I have reviewed the above documentation for accuracy and completeness, and I agree with the above.  Maylene Roes, MD

## 2019-08-05 ENCOUNTER — Encounter: Payer: Self-pay | Admitting: Dermatology

## 2019-08-29 ENCOUNTER — Other Ambulatory Visit: Payer: Self-pay

## 2019-08-29 ENCOUNTER — Ambulatory Visit (INDEPENDENT_AMBULATORY_CARE_PROVIDER_SITE_OTHER): Payer: Medicare Other | Admitting: Dermatology

## 2019-08-29 VITALS — Wt 250.0 lb

## 2019-08-29 DIAGNOSIS — Z79899 Other long term (current) drug therapy: Secondary | ICD-10-CM | POA: Diagnosis not present

## 2019-08-29 DIAGNOSIS — K130 Diseases of lips: Secondary | ICD-10-CM | POA: Diagnosis not present

## 2019-08-29 DIAGNOSIS — L853 Xerosis cutis: Secondary | ICD-10-CM

## 2019-08-29 DIAGNOSIS — L700 Acne vulgaris: Secondary | ICD-10-CM

## 2019-08-29 NOTE — Patient Instructions (Signed)
  While taking isotretinoin, do not share pills and do not donate blood. Isotretinoin is best absorbed when taken with a fatty meal. Isotretinoin can make you sensitive to the sun. Daily careful sun protection including sunscreen SPF 30+ when outdoors is recommended. 

## 2019-08-29 NOTE — Progress Notes (Signed)
    Isotretinoin Follow-Up Visit   Subjective  Phillip Buckley is a 34 y.o. male who presents for the following: Acne (Week 12 Isotretinoin 40mg  ).  Subjective  Phillip Buckley is a 34 y.o. male who presents for the following: Acne (Week 12 Isotretinoin 40mg  ).  Week # 12   Isotretinoin F/U - 08/29/19 0900      Isotretinoin Follow Up   iPledge #    Date 08/29/19    Weight 250 lb (113.4 kg)    Acne breakouts since last visit? Yes      Dosage   Target Dosage (mg) 40      Side Effects   Skin Chapped Lips    Gastrointestinal WNL    Neurological WNL    Constitutional WNL           Side effects: Dry skin, dry lips  Denies changes in night vision, shortness of breath, abdominal pain, nausea, vomiting, diarrhea, blood in stool or urine, visual changes, headaches, epistaxis, joint pain, myalgias, mood changes, depression, or suicidal ideation.   The following portions of the chart were reviewed this encounter and updated as appropriate: medications, allergies, medical history  Review of Systems:  No other skin or systemic complaints except as noted in HPI or Assessment and Plan.  Objective  Well appearing patient in no apparent distress; mood and affect are within normal limits.  An examination of the face, neck, chest, and back was performed and relevant findings are noted below.   Objective  Face: Deep closed comedones and cysts of face.   Assessment & Plan   Acne vulgaris - Severe - on systemic Isotretinoin wk #12 Face  Will plan to increase Isotretinoin 30 mg 2 po qd pending labs.   Comprehensive metabolic panel - Face  Lipid panel - Face   While taking Isotretinoin, do note share pills, and for 30 days after you finish the medication, do not donate blood. Isotretinoin is best absorbed when taken with a fatty meal. Isotretinoin can make you sensitive to the sun. Daily careful sun protection including sunscreen SPF 30+ when outdoors is  recommended.  Xerosis - Continue emollients as directed  Cheilitis - Continue lip balm as directed, Dr. [id number] Cortibalm recommended  Follow-up in 30 days.  I, 08/31/19, CMA, am acting as scribe for Clayborne Artist, MD .  Documentation: I have reviewed the above documentation for accuracy and completeness, and I agree with the above.  Joanie Coddington, MD

## 2019-08-30 ENCOUNTER — Telehealth: Payer: Self-pay

## 2019-08-30 LAB — COMPREHENSIVE METABOLIC PANEL
ALT: 39 IU/L (ref 0–44)
AST: 30 IU/L (ref 0–40)
Albumin/Globulin Ratio: 1.7 (ref 1.2–2.2)
Albumin: 4.6 g/dL (ref 4.0–5.0)
Alkaline Phosphatase: 101 IU/L (ref 48–121)
BUN/Creatinine Ratio: 13 (ref 9–20)
BUN: 13 mg/dL (ref 6–20)
Bilirubin Total: 0.4 mg/dL (ref 0.0–1.2)
CO2: 24 mmol/L (ref 20–29)
Calcium: 9.6 mg/dL (ref 8.7–10.2)
Chloride: 102 mmol/L (ref 96–106)
Creatinine, Ser: 0.97 mg/dL (ref 0.76–1.27)
GFR calc Af Amer: 119 mL/min/{1.73_m2} (ref >59)
GFR calc non Af Amer: 103 mL/min/{1.73_m2} (ref >59)
Globulin, Total: 2.7 g/dL (ref 1.5–4.5)
Glucose: 99 mg/dL (ref 65–99)
Potassium: 4.4 mmol/L (ref 3.5–5.2)
Sodium: 140 mmol/L (ref 134–144)
Total Protein: 7.3 g/dL (ref 6.0–8.5)

## 2019-08-30 LAB — LIPID PANEL
Chol/HDL Ratio: 4.8 ratio (ref 0.0–5.0)
Cholesterol, Total: 219 mg/dL — ABNORMAL HIGH (ref 100–199)
HDL: 46 mg/dL (ref >39)
LDL Chol Calc (NIH): 152 mg/dL — ABNORMAL HIGH (ref 0–99)
Triglycerides: 118 mg/dL (ref 0–149)
VLDL Cholesterol Cal: 21 mg/dL (ref 5–40)

## 2019-08-30 MED ORDER — ISOTRETINOIN 30 MG PO CAPS: 30.0000 mg | ORAL_CAPSULE | Freq: Two times a day (BID) | ORAL | 0 refills | Status: DC

## 2019-08-30 NOTE — Telephone Encounter (Signed)
Deirdre Evener, MD  08/30/2019 10:26 AM EDT Back to Top    Lab OK Continue Isotretinoin plan

## 2019-08-30 NOTE — Telephone Encounter (Signed)
Patients mother (on [place]) advised labs okay and RX sent in. Patient confirmed in ipledge.

## 2019-09-01 ENCOUNTER — Encounter: Payer: Self-pay | Admitting: Dermatology

## 2019-09-29 ENCOUNTER — Ambulatory Visit (INDEPENDENT_AMBULATORY_CARE_PROVIDER_SITE_OTHER): Payer: Medicare Other | Admitting: Dermatology

## 2019-09-29 ENCOUNTER — Other Ambulatory Visit: Payer: Self-pay

## 2019-09-29 VITALS — Wt 250.0 lb

## 2019-09-29 DIAGNOSIS — L853 Xerosis cutis: Secondary | ICD-10-CM

## 2019-09-29 DIAGNOSIS — K130 Diseases of lips: Secondary | ICD-10-CM

## 2019-09-29 DIAGNOSIS — J340 Abscess, furuncle and carbuncle of nose: Secondary | ICD-10-CM

## 2019-09-29 DIAGNOSIS — L700 Acne vulgaris: Secondary | ICD-10-CM | POA: Diagnosis not present

## 2019-09-29 DIAGNOSIS — Z79899 Other long term (current) drug therapy: Secondary | ICD-10-CM

## 2019-09-29 MED ORDER — ISOTRETINOIN 40 MG PO CAPS: ORAL_CAPSULE | ORAL | 0 refills | Status: DC

## 2019-09-29 MED ORDER — MUPIROCIN 2 % EX OINT: TOPICAL_OINTMENT | CUTANEOUS | 0 refills | Status: AC

## 2019-09-29 NOTE — Progress Notes (Signed)
   Follow-Up Visit   Subjective  Phillip Buckley is a 34 y.o. male who presents for the following: Acne (Isotretinoin 30mg  2 po QD week 16 - patient c/o dryness but no other symptoms related to medication).  The following portions of the chart were reviewed this encounter and updated as appropriate:  Tobacco  Allergies  Meds  Problems  Med Hx  Surg Hx  Fam Hx     Review of Systems:  No other skin or systemic complaints except as noted in HPI or Assessment and Plan.  Objective  Well appearing patient in no apparent distress; mood and affect are within normal limits.  A focused examination was performed including face, neck, chest and back. Relevant physical exam findings are noted in the Assessment and Plan.  Objective  Face, chest, back: Deep comedones papules and scarring of the face  Objective  inside nose: Crust    Isotretinoin F/U - 09/29/19 1000      Isotretinoin Follow Up   iPledge # 10/01/19    Date 09/29/19    Weight 250 lb (113.4 kg)    Acne breakouts since last visit? Yes      Side Effects   Skin Chapped Lips;Dry Lips    Gastrointestinal WNL    Neurological WNL    Constitutional WNL           Assessment & Plan    Acne vulgaris Face, chest, back  Week 16 - improved Increase Isotretinoin to 40mg  2 po QD. #60 0RF. While taking isotretinoin, do not share pills and do not donate blood. Isotretinoin is best absorbed when taken with a fatty meal. Isotretinoin can make you sensitive to the sun. Daily careful sun protection including sunscreen SPF 30+ when outdoors is recommended.   Other Related Medications ISOtretinoin (ACCUTANE) 40 MG capsule  Ulcer of nose (septum) inside nose  Start Mupirocin 2% ointment to aa QHS.  mupirocin ointment (BACTROBAN) 2 % - inside nose  Xerosis Secondary to Isotretinoin - diffuse xerotic patches - recommend gentle, hydrating skin care - gentle skin care handout given  Cheilitis Secondary to Isotretinoin -  Continue lip balm as directed, Dr. 10/01/19 Cortibalm or Aquaphor recommended  Return in about 1 month (around 10/30/2019).  Clayborne Artist, CMA, am acting as scribe for 11/01/2019, MD .  Documentation: I have reviewed the above documentation for accuracy and completeness, and I agree with the above.  Maylene Roes, MD

## 2019-09-29 NOTE — Patient Instructions (Signed)
  While taking isotretinoin, do not share pills and do not donate blood. Isotretinoin is best absorbed when taken with a fatty meal. Isotretinoin can make you sensitive to the sun. Daily careful sun protection including sunscreen SPF 30+ when outdoors is recommended. 

## 2019-10-03 ENCOUNTER — Encounter: Payer: Self-pay | Admitting: Dermatology

## 2019-10-03 ENCOUNTER — Other Ambulatory Visit: Payer: Self-pay

## 2019-10-03 DIAGNOSIS — L700 Acne vulgaris: Secondary | ICD-10-CM

## 2019-10-03 MED ORDER — ISOTRETINOIN 40 MG PO CAPS: ORAL_CAPSULE | ORAL | 0 refills | Status: DC

## 2019-10-03 NOTE — Progress Notes (Signed)
Patient needed to be re-confirmed in ipledge and pharmacy changed.

## 2019-11-09 ENCOUNTER — Ambulatory Visit: Payer: Medicare Other | Admitting: Dermatology

## 2019-11-10 ENCOUNTER — Ambulatory Visit (INDEPENDENT_AMBULATORY_CARE_PROVIDER_SITE_OTHER): Payer: Medicare Other | Admitting: Dermatology

## 2019-11-10 ENCOUNTER — Other Ambulatory Visit: Payer: Self-pay

## 2019-11-10 VITALS — Wt 250.0 lb

## 2019-11-10 DIAGNOSIS — K130 Diseases of lips: Secondary | ICD-10-CM | POA: Diagnosis not present

## 2019-11-10 DIAGNOSIS — Z79899 Other long term (current) drug therapy: Secondary | ICD-10-CM

## 2019-11-10 DIAGNOSIS — L853 Xerosis cutis: Secondary | ICD-10-CM

## 2019-11-10 DIAGNOSIS — L700 Acne vulgaris: Secondary | ICD-10-CM | POA: Diagnosis not present

## 2019-11-10 MED ORDER — ISOTRETINOIN 40 MG PO CAPS: 40.0000 mg | ORAL_CAPSULE | Freq: Every day | ORAL | 1 refills | Status: AC

## 2019-11-10 NOTE — Progress Notes (Signed)
   Isotretinoin Follow-Up Visit   Subjective  Phillip Buckley is a 34 y.o. male who presents for the following: Acne (6 weeks f/u week 20).  Week: 20   Side effects: Dry skin, dry lips  Denies changes in night vision, shortness of breath, abdominal pain, nausea, vomiting, diarrhea, blood in stool or urine, visual changes, headaches, epistaxis, joint pain, myalgias, mood changes, depression, or suicidal ideation.   The following portions of the chart were reviewed this encounter and updated as appropriate: medications, allergies, medical history  Review of Systems:  No other skin or systemic complaints except as noted in HPI or Assessment and Plan.  Objective  Well appearing patient in no apparent distress; mood and affect are within normal limits.  An examination of the face, neck, chest, and back was performed and relevant findings are noted below.   Objective  Head - Anterior (Face): Deep comedones, scarring and pink macules    Assessment & Plan   Acne vulgaris Head - Anterior (Face)  Cont Isotretinoin 40 mg 2 tablets daily   We will order labs at the next office visit   Recommend 200 mg/kg target dose Pt weigh 113.4 kg  Total dose to date 6900 (60.8mg /kg)  Other Related Medications ISOtretinoin (ACCUTANE) 40 MG capsule   Xerosis - Continue emollients as directed  Cheilitis - Continue lip balm as directed, Dr. Clayborne Artist Cortibalm recommended  Long term medication management (isotretinoin) - While taking Isotretinoin and for 30 days after you finish the medication, do not share pills, do not donate blood. Isotretinoin is best absorbed when taken with a fatty meal. Isotretinoin can make you sensitive to the sun. Daily careful sun protection including sunscreen SPF 30+ when outdoors is recommended.  Follow-up in 30 days.  IAngelique Holm, CMA, am acting as scribe for Armida Sans, MD .  Documentation: I have reviewed the above documentation for accuracy and  completeness, and I agree with the above.  Armida Sans, MD

## 2019-11-11 ENCOUNTER — Encounter: Payer: Self-pay | Admitting: Dermatology

## 2019-11-17 ENCOUNTER — Encounter: Payer: Self-pay | Admitting: Dermatology

## 2019-11-28 ENCOUNTER — Telehealth: Payer: Self-pay

## 2019-11-28 NOTE — Telephone Encounter (Signed)
Tried calling patient today but no answer and voicemail box has not been set up. Patients [employer] appeal has been approved. Going to reconfirm patient and send in medication and move appt out one month from today if I can get in touch with patient.

## 2019-11-29 NOTE — Telephone Encounter (Signed)
Called patient for second time but no answer and voicemail box has not been set up.

## 2019-11-30 NOTE — Telephone Encounter (Signed)
Finally spoke with patient today. He picked up medication yesterday and started taking. I have moved his appt out one week so he will be coming back in 10/21.

## 2019-12-15 ENCOUNTER — Ambulatory Visit: Payer: Medicare Other | Admitting: Dermatology

## 2019-12-22 ENCOUNTER — Other Ambulatory Visit: Payer: Self-pay

## 2019-12-22 ENCOUNTER — Ambulatory Visit (INDEPENDENT_AMBULATORY_CARE_PROVIDER_SITE_OTHER): Payer: Medicare Other | Admitting: Dermatology

## 2019-12-22 VITALS — Wt 250.0 lb

## 2019-12-22 DIAGNOSIS — K130 Diseases of lips: Secondary | ICD-10-CM

## 2019-12-22 DIAGNOSIS — Z79899 Other long term (current) drug therapy: Secondary | ICD-10-CM | POA: Diagnosis not present

## 2019-12-22 DIAGNOSIS — L700 Acne vulgaris: Secondary | ICD-10-CM

## 2019-12-22 DIAGNOSIS — L853 Xerosis cutis: Secondary | ICD-10-CM | POA: Diagnosis not present

## 2019-12-22 MED ORDER — ISOTRETINOIN 40 MG PO CAPS: ORAL_CAPSULE | ORAL | 0 refills | Status: DC

## 2019-12-22 NOTE — Progress Notes (Signed)
   Isotretinoin Follow-Up Visit   Subjective  Phillip Buckley is a 34 y.o. male who presents for the following: Acne (face, 68m f/u, wk 24 Isotretinoin, Isotretinoin 40mg  2 po qd).  Week # 24   Isotretinoin F/U - 12/22/19 0800      Isotretinoin Follow Up   iPledge # 12/24/19    Date 12/22/19    Weight 250 lb (113.4 kg)    Acne breakouts since last visit? No      Dosage   Current (To Date) Dosage (mg) 80mg       Side Effects   Skin Dry Nose    Gastrointestinal WNL    Neurological WNL    Constitutional WNL           Side effects: Dry skin, dry lips  Denies changes in night vision, shortness of breath, abdominal pain, nausea, vomiting, diarrhea, blood in stool or urine, visual changes, headaches, epistaxis, joint pain, myalgias, mood changes, depression, or suicidal ideation.   The following portions of the chart were reviewed this encounter and updated as appropriate: medications, allergies, medical history  Review of Systems:  No other skin or systemic complaints except as noted in HPI or Assessment and Plan.  Objective  Well appearing patient in no apparent distress; mood and affect are within normal limits.  An examination of the face, neck, chest, and back was performed and relevant findings are noted below.   Objective  face: Deep comedones and scattered paps and pink macules cheeks   Assessment & Plan   Acne vulgaris - severe - week #24 Isotretinoin face  Isotretinoin Week 24 IPLEDGE # 12/24/19 Pharmacy [facility] in [place]  Recommend 200 mg/kg target dose Pt weigh 113.4 kg  Total dose to date 9300mg , 82mg /kg  Will recommend labs next month since pt has been off/out of medication for a week  Cont Isotretinoin 40mg  2 po qd with fatty meal #60 0rf Pt confirmed in IPLDEGE program  Reordered Medications ISOtretinoin (ACCUTANE) 40 MG capsule   Xerosis - Continue emollients as directed  Cheilitis - Continue lip balm as directed, Dr. [id number]  Cortibalm recommended  Long term medication management (isotretinoin) - While taking Isotretinoin and for 30 days after you finish the medication, do not share pills, do not donate blood. Isotretinoin is best absorbed when taken with a fatty meal. Isotretinoin can make you sensitive to the sun. Daily careful sun protection including sunscreen SPF 30+ when outdoors is recommended.  Follow-up in 30 days.  I, [place], RMA, am acting as scribe for , MD .  Documentation: I have reviewed the above documentation for accuracy and completeness, and I agree with the above.  , MD

## 2019-12-26 ENCOUNTER — Encounter: Payer: Self-pay | Admitting: Dermatology

## 2020-01-23 ENCOUNTER — Other Ambulatory Visit: Payer: Self-pay

## 2020-01-23 ENCOUNTER — Ambulatory Visit (INDEPENDENT_AMBULATORY_CARE_PROVIDER_SITE_OTHER): Payer: Medicare Other | Admitting: Dermatology

## 2020-01-23 VITALS — Wt 250.0 lb

## 2020-01-23 DIAGNOSIS — Z79899 Other long term (current) drug therapy: Secondary | ICD-10-CM

## 2020-01-23 DIAGNOSIS — L853 Xerosis cutis: Secondary | ICD-10-CM

## 2020-01-23 DIAGNOSIS — K130 Diseases of lips: Secondary | ICD-10-CM | POA: Diagnosis not present

## 2020-01-23 DIAGNOSIS — L700 Acne vulgaris: Secondary | ICD-10-CM | POA: Diagnosis not present

## 2020-01-23 NOTE — Progress Notes (Signed)
   Isotretinoin Follow-Up Visit   Subjective  Phillip Buckley is a 34 y.o. male who presents for the following: Acne (Isotretinoin f/u pt taking 40 mg 2 tablets qd ).  Week # 30 Side effects: Dry skin, dry lips  Denies changes in night vision, shortness of breath, abdominal pain, nausea, vomiting, diarrhea, blood in stool or urine, visual changes, headaches, epistaxis, joint pain, myalgias, mood changes, depression, or suicidal ideation.   The following portions of the chart were reviewed this encounter and updated as appropriate: medications, allergies, medical history  Review of Systems:  No other skin or systemic complaints except as noted in HPI or Assessment and Plan.  Objective  Well appearing patient in no apparent distress; mood and affect are within normal limits.  An examination of the face, neck, chest, and back was performed and relevant findings are noted below.   Objective  Head - Anterior (Face): Deep comedones and scattered paps and pink macules cheeks           Assessment & Plan   Acne vulgaris  - Severe and Chronic; currently on Isotretinoin and not to goal Head - Anterior (Face) Acne vulgaris - severe - week #30 Isotretinoin   Isotretinoin Week 30 IPLEDGE # [id number] Pharmacy [facility] in [place]   Recommend 200 mg/kg target dose Pt weigh 113.4 kg  Total dose to date mg 11,700, 103.2 mg/kg  Pending normal labs cont Isotretinoin 40 mg 2 tablets qd   Other Related Procedures Comprehensive metabolic panel Lipid panel  Other Related Medications ISOtretinoin (ACCUTANE) 40 MG capsule  Xerosis - Continue emollients as directed  Cheilitis - Continue lip balm as directed, Dr. Clayborne Artist Cortibalm recommended  Long term medication management (isotretinoin) - While taking Isotretinoin and for 30 days after you finish the medication, do not share pills, do not donate blood. Isotretinoin is best absorbed when taken with a fatty meal. Isotretinoin can make  you sensitive to the sun. Daily careful sun protection including sunscreen SPF 30+ when outdoors is recommended.  Follow-up in 30 days.  IAngelique Holm, CMA, am acting as scribe for Armida Sans, MD .  Documentation: I have reviewed the above documentation for accuracy and completeness, and I agree with the above.  Armida Sans, MD

## 2020-01-24 LAB — LIPID PANEL
Chol/HDL Ratio: 6.1 ratio — ABNORMAL HIGH (ref 0.0–5.0)
Cholesterol, Total: 245 mg/dL — ABNORMAL HIGH (ref 100–199)
HDL: 40 mg/dL (ref >39)
LDL Chol Calc (NIH): 163 mg/dL — ABNORMAL HIGH (ref 0–99)
Triglycerides: 228 mg/dL — ABNORMAL HIGH (ref 0–149)
VLDL Cholesterol Cal: 42 mg/dL — ABNORMAL HIGH (ref 5–40)

## 2020-01-24 LAB — COMPREHENSIVE METABOLIC PANEL
ALT: 60 IU/L — ABNORMAL HIGH (ref 0–44)
AST: 45 IU/L — ABNORMAL HIGH (ref 0–40)
Albumin/Globulin Ratio: 1.6 (ref 1.2–2.2)
Albumin: 4.7 g/dL (ref 4.0–5.0)
Alkaline Phosphatase: 120 IU/L (ref 44–121)
BUN/Creatinine Ratio: 10 (ref 9–20)
BUN: 9 mg/dL (ref 6–20)
Bilirubin Total: 0.3 mg/dL (ref 0.0–1.2)
CO2: 24 mmol/L (ref 20–29)
Calcium: 9.7 mg/dL (ref 8.7–10.2)
Chloride: 101 mmol/L (ref 96–106)
Creatinine, Ser: 0.88 mg/dL (ref 0.76–1.27)
GFR calc Af Amer: 130 mL/min/{1.73_m2} (ref >59)
GFR calc non Af Amer: 113 mL/min/{1.73_m2} (ref >59)
Globulin, Total: 2.9 g/dL (ref 1.5–4.5)
Glucose: 97 mg/dL (ref 65–99)
Potassium: 4.9 mmol/L (ref 3.5–5.2)
Sodium: 139 mmol/L (ref 134–144)
Total Protein: 7.6 g/dL (ref 6.0–8.5)

## 2020-01-25 ENCOUNTER — Telehealth: Payer: Self-pay

## 2020-01-25 DIAGNOSIS — L700 Acne vulgaris: Secondary | ICD-10-CM

## 2020-01-25 MED ORDER — ISOTRETINOIN 40 MG PO CAPS: ORAL_CAPSULE | ORAL | 0 refills | Status: DC

## 2020-01-25 NOTE — Telephone Encounter (Signed)
Patient informed of lab results and prefers to stay on Isotretinoin 40mg  2 po QD.

## 2020-01-25 NOTE — Telephone Encounter (Signed)
-----   Message from Deirdre Evener, MD sent at 01/24/2020  1:04 PM EST ----- Some mild elevation of Lipids and Liver tests. Not concerning. Would continue current Isotretinoin plan - may continue 40 mg -2 a day w food #60 Or If desired may decrease to 30 mg - 2 a day with food #60  Keep 1 month follow up appt. Call pt and discuss and send in medication

## 2020-01-31 ENCOUNTER — Encounter: Payer: Self-pay | Admitting: Dermatology

## 2020-02-22 ENCOUNTER — Other Ambulatory Visit: Payer: Self-pay

## 2020-02-22 ENCOUNTER — Ambulatory Visit (INDEPENDENT_AMBULATORY_CARE_PROVIDER_SITE_OTHER): Payer: Medicare Other | Admitting: Dermatology

## 2020-02-22 VITALS — Wt 250.0 lb

## 2020-02-22 DIAGNOSIS — L853 Xerosis cutis: Secondary | ICD-10-CM

## 2020-02-22 DIAGNOSIS — K130 Diseases of lips: Secondary | ICD-10-CM | POA: Diagnosis not present

## 2020-02-22 DIAGNOSIS — Z79899 Other long term (current) drug therapy: Secondary | ICD-10-CM

## 2020-02-22 DIAGNOSIS — L700 Acne vulgaris: Secondary | ICD-10-CM | POA: Diagnosis not present

## 2020-02-22 NOTE — Progress Notes (Signed)
   Follow-Up Visit   Subjective  Phillip Buckley is a 34 y.o. male who presents for the following: Acne (Isotretinoin week 32 - 40mg  2 po QD). Patient c/o body aches and pains, nose bleeds, and dry chapped lips.  The following portions of the chart were reviewed this encounter and updated as appropriate:   Tobacco  Allergies  Meds  Problems  Med Hx  Surg Hx  Fam Hx     Review of Systems:  No other skin or systemic complaints except as noted in HPI or Assessment and Plan.  Objective  Well appearing patient in no apparent distress; mood and affect are within normal limits.  A focused examination was performed including face, neck, chest and back and the face. Relevant physical exam findings are noted in the Assessment and Plan.  Objective  Face, chest, back: Deep comedones and scattered paps and pink macules cheeks.  Assessment & Plan  Acne vulgaris Face, chest, back  Week 32 - Severe; On Isotretinoin -  requiring FDA mandated monthly evaluations and laboratory monitoring; Chronic and Persistent; Not to Goal   Total Dose to Date: 124.3mg /kg  Total Dose: 14,100 mg/113.4kg  Due to side effects decrease dose to Isotretinoin 40mg  po QD, but we will send in #60 to last him two months (due to insurance purposes). Plan at least two more months of treatment.   We will check lab (liver test and lipids) due to slight increase of these values when last checked.   Isotretinoin F/U - 02/22/20 0800       Isotretinoin Follow Up   iPledge #    Date 02/22/20    Weight 250 lb (113.4 kg)    Acne breakouts since last visit? No      Side Effects   Skin Nosebleed;Chapped Lips;Dry Lips    Gastrointestinal WNL    Neurological Headache    Constitutional Muscle/joint aches;Fatigue            AST - Face, chest, back  ALT - Face, chest, back  Lipid Panel - Face, chest, back  Other Related Medications ISOtretinoin (ACCUTANE) 40 MG capsule  Xerosis Secondary to  Isotretinoin Treatment - diffuse xerotic patches - recommend gentle, hydrating skin care - gentle skin care handout given  Cheilitis Secondary to Isotretinoin Treatment - Continue lip balm as directed, Dr. [id number] Cortibalm or Aquaphor recommended  Return in about 1 month (around 03/24/2020) for Isotretinoin follow up .  Clayborne Artist, CMA, am acting as scribe for 03/26/2020, MD .  Documentation: I have reviewed the above documentation for accuracy and completeness, and I agree with the above.  Maylene Roes, MD

## 2020-02-23 LAB — LIPID PANEL
Chol/HDL Ratio: 5.1 ratio — ABNORMAL HIGH (ref 0.0–5.0)
Cholesterol, Total: 224 mg/dL — ABNORMAL HIGH (ref 100–199)
HDL: 44 mg/dL (ref >39)
LDL Chol Calc (NIH): 145 mg/dL — ABNORMAL HIGH (ref 0–99)
Triglycerides: 195 mg/dL — ABNORMAL HIGH (ref 0–149)
VLDL Cholesterol Cal: 35 mg/dL (ref 5–40)

## 2020-02-23 LAB — ALT: ALT: 61 IU/L — ABNORMAL HIGH (ref 0–44)

## 2020-02-23 LAB — AST: AST: 41 IU/L — ABNORMAL HIGH (ref 0–40)

## 2020-02-28 ENCOUNTER — Telehealth: Payer: Self-pay

## 2020-02-28 DIAGNOSIS — L700 Acne vulgaris: Secondary | ICD-10-CM

## 2020-02-28 NOTE — Telephone Encounter (Signed)
-----   Message from Deirdre Evener, MD sent at 02/27/2020  3:12 PM EST ----- Liver and Lipid tests very similar to previous Overall slightly improved.  Not worsening. Labs are not concerning. Continue current Isotretinoin plan  Call pt and send in Isotretinoin to Pharmacy

## 2020-02-28 NOTE — Telephone Encounter (Signed)
Left message on voicemail to return my call.  

## 2020-03-01 ENCOUNTER — Encounter: Payer: Self-pay | Admitting: Dermatology

## 2020-03-01 MED ORDER — ISOTRETINOIN 40 MG PO CAPS: ORAL_CAPSULE | ORAL | 0 refills | Status: DC

## 2020-03-01 NOTE — Telephone Encounter (Signed)
Informed at of results and plan for isotretinoin. Rx sent to pharmacy.

## 2020-03-01 NOTE — Addendum Note (Signed)
Addended by: Epifania Gore on: 03/01/2020 08:20 AM   Modules accepted: Orders

## 2020-03-26 ENCOUNTER — Other Ambulatory Visit: Payer: Self-pay

## 2020-03-26 ENCOUNTER — Ambulatory Visit (INDEPENDENT_AMBULATORY_CARE_PROVIDER_SITE_OTHER): Payer: Medicare Other | Admitting: Dermatology

## 2020-03-26 VITALS — Wt 250.0 lb

## 2020-03-26 DIAGNOSIS — L700 Acne vulgaris: Secondary | ICD-10-CM

## 2020-03-26 DIAGNOSIS — L853 Xerosis cutis: Secondary | ICD-10-CM

## 2020-03-26 DIAGNOSIS — K130 Diseases of lips: Secondary | ICD-10-CM | POA: Diagnosis not present

## 2020-03-26 DIAGNOSIS — Z79899 Other long term (current) drug therapy: Secondary | ICD-10-CM | POA: Diagnosis not present

## 2020-03-26 MED ORDER — ISOTRETINOIN 40 MG PO CAPS: ORAL_CAPSULE | ORAL | 0 refills | Status: DC

## 2020-03-26 NOTE — Progress Notes (Unsigned)
   Isotretinoin Follow-Up Visit   Subjective  Phillip Buckley is a 36 y.o. male who presents for the following: Acne (Patient here today for isotretinoin follow up. He does have stiffness with a dull ache in arms and shoulders. He was not able to have rx filled last month due to issues with insurance covering.  ).  Week # 32 (same as last month due to rx not being filled last month) [facility]   Isotretinoin F/U - 03/26/20 1000      Isotretinoin Follow Up   iPledge # [id number]    Date 03/26/20    Weight 250 lb (113.4 kg)    Acne breakouts since last visit? Yes   mostly around eyes     Dosage   Current (To Date) Dosage (mg) 80      Side Effects   Skin Dry Lips;Chapped Lips    Gastrointestinal WNL    Neurological Headache    Constitutional Muscle/joint aches;Fatigue           Side effects: Dry skin, dry lips  Denies changes in night vision, shortness of breath, abdominal pain, nausea, vomiting, diarrhea, blood in stool or urine, visual changes, headaches, epistaxis, joint pain, myalgias, mood changes, depression, or suicidal ideation.   The following portions of the chart were reviewed this encounter and updated as appropriate: medications, allergies, medical history  Review of Systems:  No other skin or systemic complaints except as noted in HPI or Assessment and Plan.  Objective  Well appearing patient in no apparent distress; mood and affect are within normal limits.  An examination of the face, neck, chest, and back was performed and relevant findings are noted below.   Objective  Face: Few deep comedones and cysts at periorbital area   Assessment & Plan   Acne vulgaris Face Severe; On Isotretinoin -  requiring FDA mandated monthly evaluations and laboratory monitoring; Chronic and Persistent; Not to Goal Total Dose to Date: 124.3mg /kg Total Dose: 14,100mg /113.4/kg  Continue isotretinoin 40mg  2 po qd #60 0RF  Acne is chronic, severe, not at  goal.  While taking isotretinoin, do not share pills and do not donate blood. Isotretinoin is best absorbed when taken with a fatty meal. Isotretinoin can make you sensitive to the sun. Daily careful sun protection including sunscreen SPF 30+ when outdoors is recommended.  Patient confirmed in iPledge and isotretinoin sent to pharmacy.   Reordered Medications ISOtretinoin (ACCUTANE) 40 MG capsule   Xerosis secondary to isotretinoin therapy - Continue emollients as directed  Cheilitis secondary to isotretinoin therapy - Continue lip balm as directed, Dr. Cortibalm recommended  Long term medication management (isotretinoin) - While taking Isotretinoin and for 30 days after you finish the medication, do not share pills, do not donate blood. Isotretinoin is best absorbed when taken with a fatty meal. Isotretinoin can make you sensitive to the sun. Daily careful sun protection including sunscreen SPF 30+ when outdoors is recommended.  Follow-up in 30 days.  Documentation: I have reviewed the above documentation for accuracy and completeness, and I agree with the above.  Clayborne Artist, MD

## 2020-03-26 NOTE — Patient Instructions (Signed)
  While taking isotretinoin, do not share pills and do not donate blood. Isotretinoin is best absorbed when taken with a fatty meal. Isotretinoin can make you sensitive to the sun. Daily careful sun protection including sunscreen SPF 30+ when outdoors is recommended. 

## 2020-03-27 ENCOUNTER — Telehealth: Payer: Self-pay

## 2020-03-27 ENCOUNTER — Encounter: Payer: Self-pay | Admitting: Dermatology

## 2020-03-27 NOTE — Telephone Encounter (Signed)
Called patient to see if he was able to get isotretinoin, left VM, JS

## 2020-04-30 ENCOUNTER — Other Ambulatory Visit: Payer: Self-pay

## 2020-04-30 ENCOUNTER — Ambulatory Visit: Payer: Medicare Other | Admitting: Dermatology

## 2020-04-30 ENCOUNTER — Ambulatory Visit (INDEPENDENT_AMBULATORY_CARE_PROVIDER_SITE_OTHER): Payer: Medicare Other | Admitting: Dermatology

## 2020-04-30 VITALS — Wt 250.0 lb

## 2020-04-30 DIAGNOSIS — L700 Acne vulgaris: Secondary | ICD-10-CM | POA: Diagnosis not present

## 2020-04-30 DIAGNOSIS — Z79899 Other long term (current) drug therapy: Secondary | ICD-10-CM

## 2020-04-30 DIAGNOSIS — L853 Xerosis cutis: Secondary | ICD-10-CM

## 2020-04-30 DIAGNOSIS — K130 Diseases of lips: Secondary | ICD-10-CM

## 2020-04-30 MED ORDER — ISOTRETINOIN 40 MG PO CAPS: ORAL_CAPSULE | ORAL | 0 refills | Status: DC

## 2020-04-30 NOTE — Patient Instructions (Signed)
Acne is chronic, severe, not at goal.  While taking isotretinoin, do not share pills and do not donate blood. Isotretinoin is best absorbed when taken with a fatty meal. Isotretinoin can make you sensitive to the sun. Daily careful sun protection including sunscreen SPF 30+ when outdoors is recommended. 

## 2020-04-30 NOTE — Progress Notes (Signed)
   Isotretinoin Follow-Up Visit   Subjective  Phillip Buckley is a 35 y.o. male who presents for the following: Acne (Isotretinoin ). Pt taking Isotretinoin 40 mg 2 tablets daily with a good response  Week # 36  Side effects: Dry skin, dry lips  Denies changes in night vision, shortness of breath, abdominal pain, nausea, vomiting, diarrhea, blood in stool or urine, visual changes, headaches, epistaxis, joint pain, myalgias, mood changes, depression, or suicidal ideation.   The following portions of the chart were reviewed this encounter and updated as appropriate: medications, allergies, medical history  Review of Systems:  No other skin or systemic complaints except as noted in HPI or Assessment and Plan.  Objective  Well appearing patient in no apparent distress; mood and affect are within normal limits.  An examination of the face, neck, chest, and back was performed and relevant findings are noted below.   Objective  face: Few deep comedones and cysts at periorbital area   Assessment & Plan   Acne vulgaris face Severe; On Isotretinoin -  requiring FDA mandated monthly evaluations and laboratory monitoring; Chronic and Persistent; Not to Goal Severe and Chronic; currently on Isotretinoin and not to goal   Acne is chronic, severe, not at goal.  While taking isotretinoin, do not share pills and do not donate blood. Isotretinoin is best absorbed when taken with a fatty meal. Isotretinoin can make you sensitive to the sun. Daily careful sun protection including sunscreen SPF 30+ when outdoors is recommended.   Labs reviewed from 02/2020 WNL  Total Dose to Date: 145.5 mg/kg Total Dose: 16,500 mg/113.4/kg Cont Isotretinoin 40 mg take 2 tablets daily   Reordered Medications ISOtretinoin (ACCUTANE) 40 MG capsule   Xerosis secondary to isotretinoin therapy - Continue emollients as directed  Cheilitis secondary to isotretinoin therapy - Continue lip balm as directed, Dr.  Clayborne Artist Cortibalm recommended  Long term medication management (isotretinoin) - While taking Isotretinoin and for 30 days after you finish the medication, do not share pills, do not donate blood. Isotretinoin is best absorbed when taken with a fatty meal. Isotretinoin can make you sensitive to the sun. Daily careful sun protection including sunscreen SPF 30+ when outdoors is recommended.  Follow-up in 30 days.  IAngelique Holm, CMA, am acting as scribe for Armida Sans, MD .  Documentation: I have reviewed the above documentation for accuracy and completeness, and I agree with the above.  Armida Sans, MD

## 2020-05-01 ENCOUNTER — Encounter: Payer: Self-pay | Admitting: Dermatology

## 2020-05-31 ENCOUNTER — Ambulatory Visit (INDEPENDENT_AMBULATORY_CARE_PROVIDER_SITE_OTHER): Payer: Medicare Other | Admitting: Dermatology

## 2020-05-31 ENCOUNTER — Encounter: Payer: Self-pay | Admitting: Dermatology

## 2020-05-31 ENCOUNTER — Other Ambulatory Visit: Payer: Self-pay

## 2020-05-31 VITALS — Wt 250.0 lb

## 2020-05-31 DIAGNOSIS — L700 Acne vulgaris: Secondary | ICD-10-CM

## 2020-05-31 DIAGNOSIS — Z79899 Other long term (current) drug therapy: Secondary | ICD-10-CM

## 2020-05-31 DIAGNOSIS — L853 Xerosis cutis: Secondary | ICD-10-CM | POA: Diagnosis not present

## 2020-05-31 DIAGNOSIS — L905 Scar conditions and fibrosis of skin: Secondary | ICD-10-CM | POA: Diagnosis not present

## 2020-05-31 DIAGNOSIS — K130 Diseases of lips: Secondary | ICD-10-CM | POA: Diagnosis not present

## 2020-05-31 MED ORDER — ISOTRETINOIN 40 MG PO CAPS: ORAL_CAPSULE | ORAL | 0 refills | Status: DC

## 2020-05-31 NOTE — Progress Notes (Signed)
   Isotretinoin Follow-Up Visit   Subjective  Phillip Buckley is a 35 y.o. male who presents for the following: Acne (Face, wk 40 Isotretinoin, Isotretinoin 40mg  2 po qd, last labs 02/22/20).  Week # 40   Isotretinoin F/U - 05/31/20 0900      Isotretinoin Follow Up   iPledge # 06/02/20    Date 05/31/20    Weight 250 lb (113.4 kg)    Acne breakouts since last visit? No      Dosage   Target Dosage (mg) 17,010    Current (To Date) Dosage (mg) 18,900      Side Effects   Skin Chapped Lips;Dry Lips;Dry Nose    Gastrointestinal WNL    Neurological WNL    Constitutional Muscle/joint aches;Other   stiffness, cramping          Side effects: Dry skin, dry lips  Denies changes in night vision, shortness of breath, abdominal pain, nausea, vomiting, diarrhea, blood in stool or urine, visual changes, headaches, epistaxis, joint pain, myalgias, mood changes, depression, or suicidal ideation.   The following portions of the chart were reviewed this encounter and updated as appropriate: medications, allergies, medical history  Review of Systems:  No other skin or systemic complaints except as noted in HPI or Assessment and Plan.  Objective  Well appearing patient in no apparent distress; mood and affect are within normal limits.  An examination of the face, neck, chest, and back was performed and relevant findings are noted below.   Objective  face: Deep comedones and paps face   Assessment & Plan   Acne vulgaris - severe with scarring - persistent face Severe; On Isotretinoin -  requiring FDA mandated monthly evaluations and laboratory monitoring; Chronic and Persistent; Not to Goal - but pt ready to discontinue med.  Wk 40 IPLEDGE # 06/02/20 Pharmacy [facility] in [place]  Total mg = 18,900mg  Total mg/kg = 166.7  Discussed cont 1 more month of treatment Cont Isotretinoin 40 mg 2 po qd, if pt has cramping/joint aches may decrease to 1 po qd  Pt confirmed in IPLEDGE  program  Reordered Medications ISOtretinoin (ACCUTANE) 40 MG capsule   Xerosis secondary to isotretinoin therapy - Continue emollients as directed - sample of Halog cr Lot RABK 12/22  Cheilitis secondary to isotretinoin therapy - Continue lip balm as directed, Dr. 1/23 Cortibalm recommended  Long term medication management (isotretinoin) - While taking Isotretinoin and for 30 days after you finish the medication, do not share pills, do not donate blood. Isotretinoin is best absorbed when taken with a fatty meal. Isotretinoin can make you sensitive to the sun. Daily careful sun protection including sunscreen SPF 30+ when outdoors is recommended.  Follow-up in 6 months  I, Clayborne Artist, RMA, am acting as scribe for Ardis Rowan, MD .  Documentation: I have reviewed the above documentation for accuracy and completeness, and I agree with the above.  Armida Sans, MD

## 2020-05-31 NOTE — Patient Instructions (Signed)

## 2020-06-06 ENCOUNTER — Telehealth: Payer: Self-pay

## 2020-06-06 NOTE — Telephone Encounter (Signed)
Advise him of this and he may just stop when he is finished with current pills and see me in 6 mos as scheduled.

## 2020-06-06 NOTE — Telephone Encounter (Signed)
[employer] is denying Isotretinoin again. Patient must be off therapy for two months or more for this to be covered any longer.

## 2020-06-07 NOTE — Telephone Encounter (Signed)
Called patient but no answer and voicemail was full.

## 2020-06-12 NOTE — Telephone Encounter (Signed)
Call patient again. No answer and voicemail full.

## 2020-06-13 NOTE — Telephone Encounter (Signed)
Made 3rd attempt to reach patient. No answer and voicemail full.

## 2020-11-01 ENCOUNTER — Ambulatory Visit: Payer: Medicare Other | Admitting: Dermatology

## 2020-11-08 ENCOUNTER — Other Ambulatory Visit: Payer: Self-pay

## 2020-11-08 ENCOUNTER — Ambulatory Visit (INDEPENDENT_AMBULATORY_CARE_PROVIDER_SITE_OTHER): Payer: Medicare Other | Admitting: Dermatology

## 2020-11-08 DIAGNOSIS — L700 Acne vulgaris: Secondary | ICD-10-CM

## 2020-11-08 MED ORDER — AKLIEF 0.005 % EX CREA: TOPICAL_CREAM | CUTANEOUS | 3 refills | Status: AC

## 2020-11-08 NOTE — Progress Notes (Signed)
   Follow-Up Visit   Subjective  Phillip Buckley is a 35 y.o. male who presents for the following: Acne (6 mo acne f/u. Pt finished course of accutane approx 6 mos ago. Pt reports minimal breakouts since then. Some painful cystic bumps. Pt does not want to restart accutane. ).  The following portions of the chart were reviewed this encounter and updated as appropriate:  Tobacco  Allergies  Meds  Problems  Med Hx  Surg Hx  Fam Hx     Review of Systems: No other skin or systemic complaints except as noted in HPI or Assessment and Plan.  Objective  Well appearing patient in no apparent distress; mood and affect are within normal limits.  A focused examination was performed including face. Relevant physical exam findings are noted in the Assessment and Plan.  Head - Anterior (Face) Active papule on right chin Pink macules  Superficial scarring Some deep white closed comedones on cheeks  Assessment & Plan  Acne vulgaris -status post isotretinoin course finished approximately 6 months ago with significant improvement but not entirely clear.  Not to goal. Patient decided to end his course due to some side effects and insurance not covering continued treatment despite not meeting goals. He does not want to restart Accutane at this time. Head - Anterior (Face)  Start Aklief to spot treat as needed. Sample given today.  If not covered will send epiduo forte.   Trifarotene (AKLIEF) 0.005 % CREA - Head - Anterior (Face) Apply to affected areas with acne to spot treat daily as needed.  Return if symptoms worsen or fail to improve.  Phillip Buckley, CMA, am acting as scribe for Armida Sans, MD. Documentation: I have reviewed the above documentation for accuracy and completeness, and I agree with the above.  Armida Sans, MD

## 2020-11-13 ENCOUNTER — Encounter: Payer: Self-pay | Admitting: Dermatology

## 2022-11-14 ENCOUNTER — Other Ambulatory Visit: Payer: Self-pay | Admitting: Family Medicine

## 2022-11-14 DIAGNOSIS — G8929 Other chronic pain: Secondary | ICD-10-CM

## 2022-11-18 ENCOUNTER — Ambulatory Visit: Admission: RE | Admit: 2022-11-18 | Payer: 59 | Source: Ambulatory Visit
